# Patient Record
Sex: Female | Born: 1940 | Race: White | Hispanic: No | Marital: Married | Smoking: Former smoker
Health system: Southern US, Community
[De-identification: ages and names within clinical notes are randomized; demographics above are authoritative.]

## PROBLEM LIST (undated history)

## (undated) DIAGNOSIS — M7989 Other specified soft tissue disorders: Secondary | ICD-10-CM

## (undated) DIAGNOSIS — F17200 Nicotine dependence, unspecified, uncomplicated: Secondary | ICD-10-CM

## (undated) DIAGNOSIS — J449 Chronic obstructive pulmonary disease, unspecified: Secondary | ICD-10-CM

## (undated) DIAGNOSIS — C801 Malignant (primary) neoplasm, unspecified: Secondary | ICD-10-CM

## (undated) DIAGNOSIS — M1990 Unspecified osteoarthritis, unspecified site: Secondary | ICD-10-CM

## (undated) HISTORY — PX: NEPHRECTOMY: SHX65

## (undated) HISTORY — PX: TRIGGER FINGER RELEASE: SHX641

## (undated) HISTORY — PX: KNEE ARTHROSCOPY: SUR90

## (undated) HISTORY — PX: BACK SURGERY: SHX140

## (undated) HISTORY — DX: Malignant (primary) neoplasm, unspecified: C80.1

## (undated) HISTORY — PX: JOINT REPLACEMENT: SHX530

## (undated) HISTORY — PX: ORIF HIP FRACTURE: SHX2125

## (undated) HISTORY — PX: SPINE SURGERY: SHX786

---

## 2000-02-11 ENCOUNTER — Encounter: Payer: Self-pay | Admitting: Urology

## 2000-02-13 ENCOUNTER — Inpatient Hospital Stay (HOSPITAL_COMMUNITY): Admission: RE | Admit: 2000-02-13 | Discharge: 2000-02-14 | Payer: Self-pay | Admitting: Urology

## 2000-09-06 ENCOUNTER — Encounter: Payer: Self-pay | Admitting: Urology

## 2000-09-06 ENCOUNTER — Encounter: Admission: RE | Admit: 2000-09-06 | Discharge: 2000-09-06 | Payer: Self-pay | Admitting: Urology

## 2001-03-16 ENCOUNTER — Encounter: Admission: RE | Admit: 2001-03-16 | Discharge: 2001-03-16 | Payer: Self-pay | Admitting: Urology

## 2001-03-16 ENCOUNTER — Encounter: Payer: Self-pay | Admitting: Urology

## 2001-09-20 ENCOUNTER — Encounter: Payer: Self-pay | Admitting: Urology

## 2001-09-20 ENCOUNTER — Encounter: Admission: RE | Admit: 2001-09-20 | Discharge: 2001-09-20 | Payer: Self-pay | Admitting: Urology

## 2002-04-04 ENCOUNTER — Encounter: Payer: Self-pay | Admitting: Urology

## 2002-04-04 ENCOUNTER — Encounter: Admission: RE | Admit: 2002-04-04 | Discharge: 2002-04-04 | Payer: Self-pay | Admitting: Urology

## 2003-01-16 ENCOUNTER — Encounter: Payer: Self-pay | Admitting: Urology

## 2003-01-16 ENCOUNTER — Encounter: Admission: RE | Admit: 2003-01-16 | Discharge: 2003-01-16 | Payer: Self-pay | Admitting: Urology

## 2003-07-18 ENCOUNTER — Ambulatory Visit (HOSPITAL_COMMUNITY): Admission: RE | Admit: 2003-07-18 | Discharge: 2003-07-18 | Payer: Self-pay | Admitting: Urology

## 2004-01-05 ENCOUNTER — Inpatient Hospital Stay (HOSPITAL_COMMUNITY): Admission: EM | Admit: 2004-01-05 | Discharge: 2004-01-08 | Payer: Self-pay | Admitting: Emergency Medicine

## 2004-02-06 ENCOUNTER — Encounter: Admission: RE | Admit: 2004-02-06 | Discharge: 2004-02-06 | Payer: Self-pay | Admitting: Urology

## 2004-03-12 ENCOUNTER — Encounter: Admission: RE | Admit: 2004-03-12 | Discharge: 2004-03-12 | Payer: Self-pay | Admitting: Urology

## 2005-04-03 ENCOUNTER — Encounter: Admission: RE | Admit: 2005-04-03 | Discharge: 2005-04-03 | Payer: Self-pay | Admitting: Urology

## 2010-06-14 ENCOUNTER — Encounter: Payer: Self-pay | Admitting: Urology

## 2010-08-16 ENCOUNTER — Ambulatory Visit (HOSPITAL_COMMUNITY)
Admission: RE | Admit: 2010-08-16 | Discharge: 2010-08-16 | Disposition: A | Discharge status: Home / Self Care | Payer: Medicare Other | Source: Ambulatory Visit | Attending: Family Medicine | Admitting: Family Medicine

## 2010-08-16 ENCOUNTER — Other Ambulatory Visit: Payer: Self-pay | Admitting: Family Medicine

## 2010-08-16 DIAGNOSIS — R1032 Left lower quadrant pain: Secondary | ICD-10-CM | POA: Insufficient documentation

## 2010-08-16 DIAGNOSIS — J984 Other disorders of lung: Secondary | ICD-10-CM | POA: Insufficient documentation

## 2010-08-16 DIAGNOSIS — K7689 Other specified diseases of liver: Secondary | ICD-10-CM | POA: Insufficient documentation

## 2010-08-16 DIAGNOSIS — R110 Nausea: Secondary | ICD-10-CM | POA: Insufficient documentation

## 2010-08-16 DIAGNOSIS — R300 Dysuria: Secondary | ICD-10-CM | POA: Insufficient documentation

## 2010-08-16 DIAGNOSIS — M47817 Spondylosis without myelopathy or radiculopathy, lumbosacral region: Secondary | ICD-10-CM | POA: Insufficient documentation

## 2010-08-16 DIAGNOSIS — Z905 Acquired absence of kidney: Secondary | ICD-10-CM | POA: Insufficient documentation

## 2010-08-16 DIAGNOSIS — K5732 Diverticulitis of large intestine without perforation or abscess without bleeding: Secondary | ICD-10-CM | POA: Insufficient documentation

## 2010-08-16 LAB — CREATININE, SERUM
GFR calc Af Amer: >60 mL/min (ref >60)
GFR calc non Af Amer: >60 mL/min (ref >60)

## 2010-08-16 LAB — BUN: BUN: 12 mg/dL (ref 6–23)

## 2010-08-16 MED ORDER — IOHEXOL 300 MG/ML  SOLN
80.0000 mL | Freq: Once | INTRAMUSCULAR | Status: AC | PRN
  Administered 2010-08-16: 80 mL via INTRAVENOUS

## 2013-01-22 ENCOUNTER — Ambulatory Visit (INDEPENDENT_AMBULATORY_CARE_PROVIDER_SITE_OTHER): Payer: Medicare Other | Admitting: Emergency Medicine

## 2013-01-22 ENCOUNTER — Ambulatory Visit: Payer: Medicare Other

## 2013-01-22 VITALS — BP 150/90 | HR 93 | Temp 98.5°F | Resp 15 | Ht 64.8 in | Wt 144.0 lb

## 2013-01-22 DIAGNOSIS — R05 Cough: Secondary | ICD-10-CM

## 2013-01-22 DIAGNOSIS — J209 Acute bronchitis, unspecified: Secondary | ICD-10-CM

## 2013-01-22 DIAGNOSIS — J3489 Other specified disorders of nose and nasal sinuses: Secondary | ICD-10-CM | POA: Diagnosis not present

## 2013-01-22 DIAGNOSIS — F17200 Nicotine dependence, unspecified, uncomplicated: Secondary | ICD-10-CM

## 2013-01-22 DIAGNOSIS — Z720 Tobacco use: Secondary | ICD-10-CM | POA: Diagnosis present

## 2013-01-22 DIAGNOSIS — R0981 Nasal congestion: Secondary | ICD-10-CM

## 2013-01-22 LAB — POCT CBC
Hemoglobin: 16.9 g/dL — AB (ref 12.2–16.2)
MCH, POC: 31.4 pg — AB (ref 27–31.2)
MCV: 95.1 fL (ref 80–97)
MPV: 9.2 fL (ref 0–99.8)
POC MID %: 5.3 %M (ref 0–12)
RBC: 5.38 M/uL (ref 4.04–5.48)
WBC: 14.1 10*3/uL — AB (ref 4.6–10.2)

## 2013-01-22 MED ORDER — BENZONATATE 100 MG PO CAPS: 100.0000 mg | ORAL_CAPSULE | Freq: Three times a day (TID) | ORAL | Status: DC | PRN

## 2013-01-22 MED ORDER — HYDROCOD POLST-CHLORPHEN POLST 10-8 MG/5ML PO LQCR: 5.0000 mL | Freq: Two times a day (BID) | ORAL | Status: DC | PRN

## 2013-01-22 MED ORDER — LEVOFLOXACIN 500 MG PO TABS: 500.0000 mg | ORAL_TABLET | Freq: Every day | ORAL | Status: AC

## 2013-01-22 NOTE — Progress Notes (Signed)
  Subjective:    Patient ID: Sharon Wilkinson, female    DOB: Aug 01, 1940, 72 y.o.   MRN: [id number]  HPI 71 yo female presents today with a cough for about 10 days. She has had some mucus sometimes. No wheezing, not SOB, She has not had any xray since the last time she was in office. She has had about 1 pack of cigarettes over the last month.     Review of Systems     Objective:   Physical Exam is alert and cooperative she is not in any distress. There is wax in the left external auditory canal. The right TM is normal. There is significant clear rhinorrhea appear throat is normal she has dentures. Chest exam reveals coarse rhonchi present in both lung fields. There are no areas of dullness. Heart regular rate no murmurs or gallops. Abdomen soft nontender [facility] reading (PRIMARY) by  Dr.Jackelyne Sayer there are increased interstitial markings in both bases and right middle lobe. There no consolidated areas.  Results for orders placed in visit on 01/22/13  POCT CBC      Result Value Range   WBC 14.1 (*) 4.6 - 10.2 K/uL   Lymph, poc 3.1  0.6 - 3.4   POC LYMPH PERCENT 21.7  10 - 50 %L   MID (cbc) 0.7  0 - 0.9   POC MID % 5.3  0 - 12 %M   POC Granulocyte 10.3 (*) 2 - 6.9   Granulocyte percent 73.0  37 - 80 %G   RBC 5.38  4.04 - 5.48 M/uL   Hemoglobin 16.9 (*) 12.2 - 16.2 g/dL   HCT, POC 14.7 (*) 82.9 - 47.9 %   MCV 95.1  80 - 97 fL   MCH, POC 31.4 (*) 27 - 31.2 pg   MCHC 33.0  31.8 - 35.4 g/dL   RDW, POC 56.2     Platelet Count, POC 321  142 - 424 K/uL   MPV 9.2  0 - 99.8 fL         Assessment & Plan:  14,000. Gland treat with Levaquin medication for cough with Tessalon Perles and to take Mucinex twice a day. Recheck in 48-72 hours if not improving.

## 2013-01-22 NOTE — Patient Instructions (Signed)

## 2015-06-19 DIAGNOSIS — M1711 Unilateral primary osteoarthritis, right knee: Secondary | ICD-10-CM | POA: Diagnosis not present

## 2015-06-19 DIAGNOSIS — M25551 Pain in right hip: Secondary | ICD-10-CM | POA: Diagnosis not present

## 2016-02-11 ENCOUNTER — Ambulatory Visit (INDEPENDENT_AMBULATORY_CARE_PROVIDER_SITE_OTHER): Payer: Medicare Other | Admitting: Urgent Care

## 2016-02-11 ENCOUNTER — Ambulatory Visit (INDEPENDENT_AMBULATORY_CARE_PROVIDER_SITE_OTHER): Payer: Medicare Other

## 2016-02-11 VITALS — BP 122/72 | HR 78 | Temp 97.6°F | Resp 17 | Ht 66.0 in | Wt 148.0 lb

## 2016-02-11 DIAGNOSIS — M545 Low back pain: Secondary | ICD-10-CM

## 2016-02-11 DIAGNOSIS — M25551 Pain in right hip: Secondary | ICD-10-CM | POA: Diagnosis not present

## 2016-02-11 DIAGNOSIS — S79911A Unspecified injury of right hip, initial encounter: Secondary | ICD-10-CM | POA: Diagnosis not present

## 2016-02-11 DIAGNOSIS — W19XXXA Unspecified fall, initial encounter: Secondary | ICD-10-CM

## 2016-02-11 DIAGNOSIS — M1611 Unilateral primary osteoarthritis, right hip: Secondary | ICD-10-CM

## 2016-02-11 DIAGNOSIS — M5136 Other intervertebral disc degeneration, lumbar region: Secondary | ICD-10-CM | POA: Diagnosis not present

## 2016-02-11 DIAGNOSIS — S3992XA Unspecified injury of lower back, initial encounter: Secondary | ICD-10-CM | POA: Diagnosis not present

## 2016-02-11 MED ORDER — PREDNISONE 20 MG PO TABS: ORAL_TABLET | ORAL | 0 refills | Status: DC

## 2016-02-11 NOTE — Patient Instructions (Addendum)
Degenerative Disk Disease  Degenerative disk disease is a condition caused by the changes that occur in spinal disks as you grow older. Spinal disks are soft and compressible disks located between the bones of your spine (vertebrae). These disks act like shock absorbers. Degenerative disk disease can affect the whole spine. However, the neck and lower back are most commonly affected. Many changes can occur in the spinal disks with aging, such as:  · The spinal disks may dry and shrink.  · Small tears may occur in the tough, outer covering of the disk (annulus).  · The disk space may become smaller due to loss of water.  · Abnormal growths in the bone (spurs) may occur. This can put pressure on the nerve roots exiting the spinal canal, causing pain.  · The spinal canal may become narrowed.  RISK FACTORS   · Being overweight.  · Having a family history of degenerative disk disease.  · Smoking.  · There is increased risk if you are doing heavy lifting or have a sudden injury.  SIGNS AND SYMPTOMS   Symptoms vary from person to person and may include:  · Pain that varies in intensity. Some people have no pain, while others have severe pain. The location of the pain depends on the part of your backbone that is affected.  ¨ You will have neck or arm pain if a disk in the neck area is affected.  ¨ You will have pain in your back, buttocks, or legs if a disk in the lower back is affected.  · Pain that becomes worse while bending, reaching up, or with twisting movements.  · Pain that may start gradually and then get worse as time passes. It may also start after a major or minor injury.  · Numbness or tingling in the arms or legs.  DIAGNOSIS   Your health care provider will ask you about your symptoms and about activities or habits that may cause the pain. He or she may also ask about any injuries, diseases, or treatments you have had. Your health care provider will examine you to check for the range of movement that is  possible in the affected area, to check for strength in your extremities, and to check for sensation in the areas of the arms and legs supplied by different nerve roots. You may also have:   · An X-ray of the spine.  · Other imaging tests, such as MRI.  TREATMENT   Your health care provider will advise you on the best plan for treatment. Treatment may include:  · Medicines.  · Rehabilitation exercises.  HOME CARE INSTRUCTIONS   · Follow proper lifting and walking techniques as advised by your health care provider.  · Maintain good posture.  · Exercise regularly as advised by your health care provider.  · Perform relaxation exercises.  · Change your sitting, standing, and sleeping habits as advised by your health care provider.  · Change positions frequently.  · Lose weight or maintain a healthy weight as advised by your health care provider.  · Do not use any tobacco products, including cigarettes, chewing tobacco, or electronic cigarettes. If you need help quitting, ask your health care provider.  · Wear supportive footwear.  · Take medicines only as directed by your health care provider.  SEEK MEDICAL CARE IF:   · Your pain does not go away within 1-4 weeks.  · You have significant appetite or weight loss.  SEEK IMMEDIATE MEDICAL CARE IF:   ·   instructions.  Will watch your condition.  Will get help right away if you are not doing well or get worse.   This information is not intended to replace advice given to you by your health care provider. Make sure you discuss any questions you have with your health care provider.   Document Released: 03/08/2007 Document Revised: 06/01/2014 Document Reviewed: 09/12/2013 Elsevier Interactive Patient Education 2016 [employer].     IF you received an  x-ray today, you will receive an invoice from [facility]. Please contact [facility] at [phone] with questions or concerns regarding your invoice.   IF you received labwork today, you will receive an invoice from [employer]. Please contact [facility] at [phone] with questions or concerns regarding your invoice.   Our billing staff will not be able to assist you with questions regarding bills from these companies.  You will be contacted with the lab results as soon as they are available. The fastest way to get your results is to activate your My Chart account. Instructions are located on the last page of this paperwork. If you have not heard from [place] regarding the results in 2 weeks, please contact this office.

## 2016-02-11 NOTE — Progress Notes (Signed)
MRN: [id number] DOB: 1940-08-16  Subjective:   CHENITA WOLIVER is a 75 y.o. female presenting for chief complaint of Hip Pain (right side )  Reports 1 week history of worsening right hip pain s/p fall. Pain is constant, worse with lying down, walking for long distances. She also has low back pain, feels like it shoots down her thigh occasionally. Has been using 400mg  ibuprofen daily with minimal relief. Denies history of arthritis, hip or back surgeries.   Ashiana currently has no medications in their medication list. Also has No Known Allergies.  Jolana  has a past medical history of Cancer ([place]). Also  has a past surgical history that includes Spine surgery and Joint replacement.  Objective:   Vitals: BP 122/72 (BP Location: Right Arm, Patient Position: Sitting, Cuff Size: Normal)   Pulse 78   Temp 97.6 F (36.4 C) (Oral)   Resp 17   Ht 5\' 6"  (1.676 m)   Wt 148 lb (67.1 kg)   SpO2 97%   BMI 23.89 kg/m   Physical Exam  Constitutional: She is oriented to person, place, and time. She appears well-developed and well-nourished.  Cardiovascular: Normal rate.   Pulmonary/Chest: Effort normal.  Abdominal: Soft. Bowel sounds are normal. She exhibits no distension and no mass. There is no tenderness. There is no guarding.  Musculoskeletal:       Right hip: She exhibits decreased range of motion (external and internal rotation, flexion) and tenderness (over area depicted). She exhibits normal strength, no bony tenderness, no swelling, no crepitus, no deformity and no laceration.       Lumbar back: She exhibits decreased range of motion (flexion, extension, negative SLR). She exhibits no tenderness, no bony tenderness, no swelling, no edema, no deformity, no laceration and no spasm.       Legs: Neurological: She is alert and oriented to person, place, and time.  Skin: Skin is warm and dry.   Dg Lumbar Spine Complete  Result Date: 02/11/2016 CLINICAL DATA:  One week history of worsening  RIGHT hip pain after a fall. EXAM: LUMBAR SPINE - COMPLETE 4+ VIEW COMPARISON:  None. FINDINGS: There is no evidence of lumbar spine fracture. Alignment is normal. Multilevel intervertebral disc space narrowing, worst at L4-5. Lower lumbar facet arthropathy. Osteopenia. IMPRESSION: Lumbar degenerative disc disease.  No compression fracture. Electronically Signed   By: Staci Righter M.D.   On: 02/11/2016 12:35   Dg Hip Unilat W Or W/o Pelvis 2-3 Views Right  Result Date: 02/11/2016 CLINICAL DATA:  75 year old female with 1 week history of worsening right hip pain post fall. Surgery 10 years ago. Initial encounter. EXAM: DG HIP (WITH OR WITHOUT PELVIS) 2-3V RIGHT COMPARISON:  None. FINDINGS: Post right hip surgery with dynamic sliding type screw without complication noted. Bony overgrowth at the lesser trochanter and greater trochanter level appear chronic. No acute fracture or dislocation. Degenerative changes lumbar spine most notable L4-5 level incompletely assessed. IMPRESSION: Post right hip surgery without acute abnormality noted. L4-5 degenerative changes. Electronically Signed   By: Genia Del M.D.   On: 02/11/2016 12:36     Assessment and Plan :   1. Low back pain without sciatica, unspecified back pain laterality 2. Right hip pain 3. Fall, initial encounter - Patient likely suffered hip contusion but physical exam findings are very reassuring. Anticipatory guidance provided. See medications as below.  4. DDD (degenerative disc disease), lumbar 5. Osteoarthritis of right hip, unspecified osteoarthritis type - Will have patient undergo short  steroid course for her pain and inflammation likely worsened by her recent fall. RTC in 1 week if no improvement. Patient may also use APAP for pain management.  6. Osteopenia - Discussed findings with patient, she will need a bone scan. I recommended patient set up f/u with her PCP for this. Patient agreed.   Jaynee Eagles, PA-C [facility] [phone] 02/11/2016 12:06 PM

## 2016-08-05 DIAGNOSIS — M25551 Pain in right hip: Secondary | ICD-10-CM | POA: Diagnosis not present

## 2016-08-05 DIAGNOSIS — M25561 Pain in right knee: Secondary | ICD-10-CM | POA: Diagnosis not present

## 2016-11-02 DIAGNOSIS — M79641 Pain in right hand: Secondary | ICD-10-CM | POA: Diagnosis not present

## 2017-03-10 DIAGNOSIS — M65341 Trigger finger, right ring finger: Secondary | ICD-10-CM | POA: Diagnosis not present

## 2017-04-27 IMAGING — DX DG HIP (WITH OR WITHOUT PELVIS) 2-3V*R*
4 series · 4 of 4 positions shown · non-contrast
Comparison: None.

CLINICAL DATA: 75-year-old female with 1 week history of worsening
right hip pain post fall. Surgery 10 years ago. Initial encounter.

EXAM:
DG HIP (WITH OR WITHOUT PELVIS) 2-3V RIGHT

[pelvis ap]
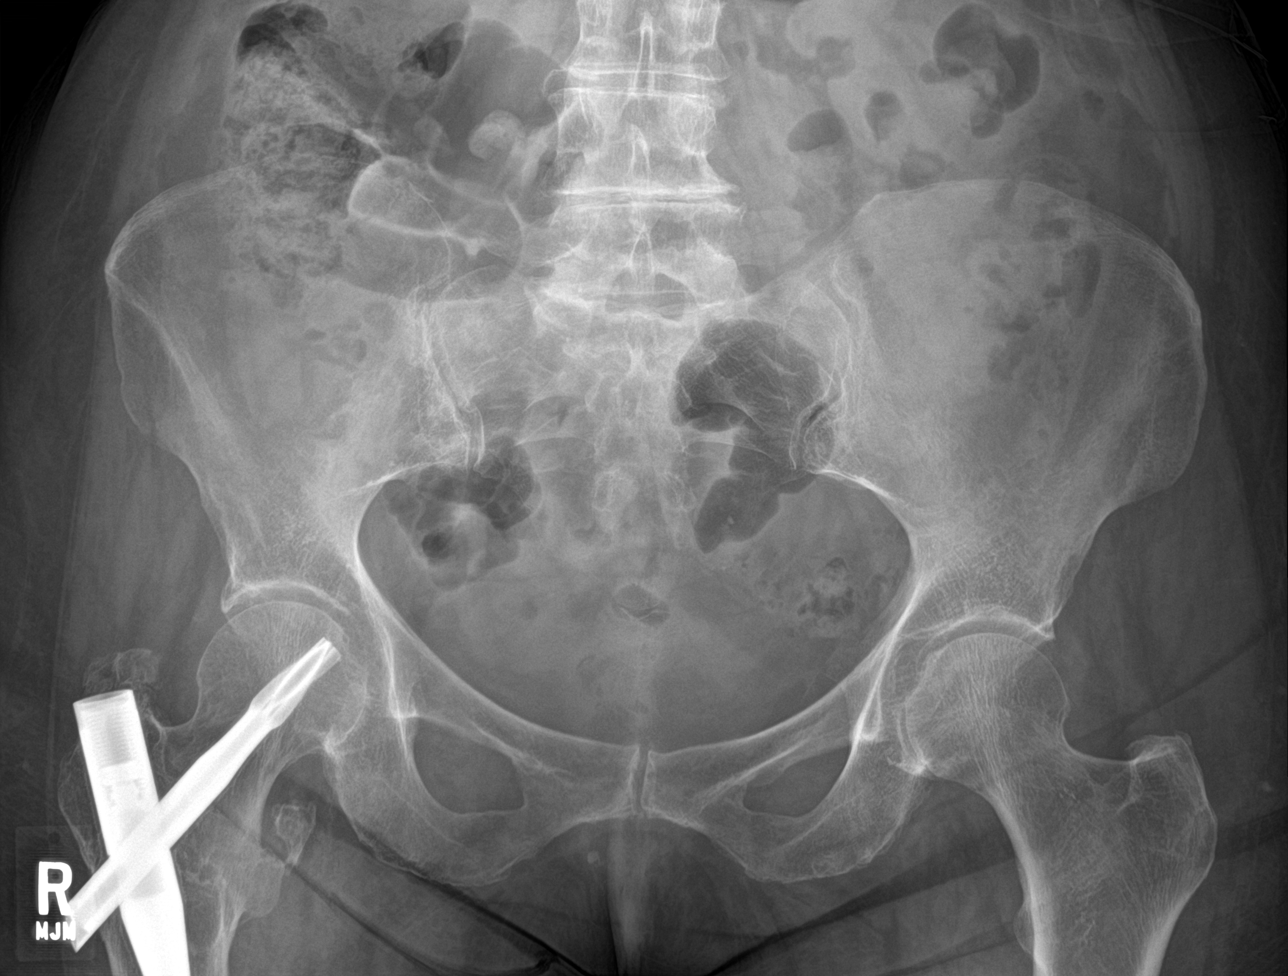

[hip ap]
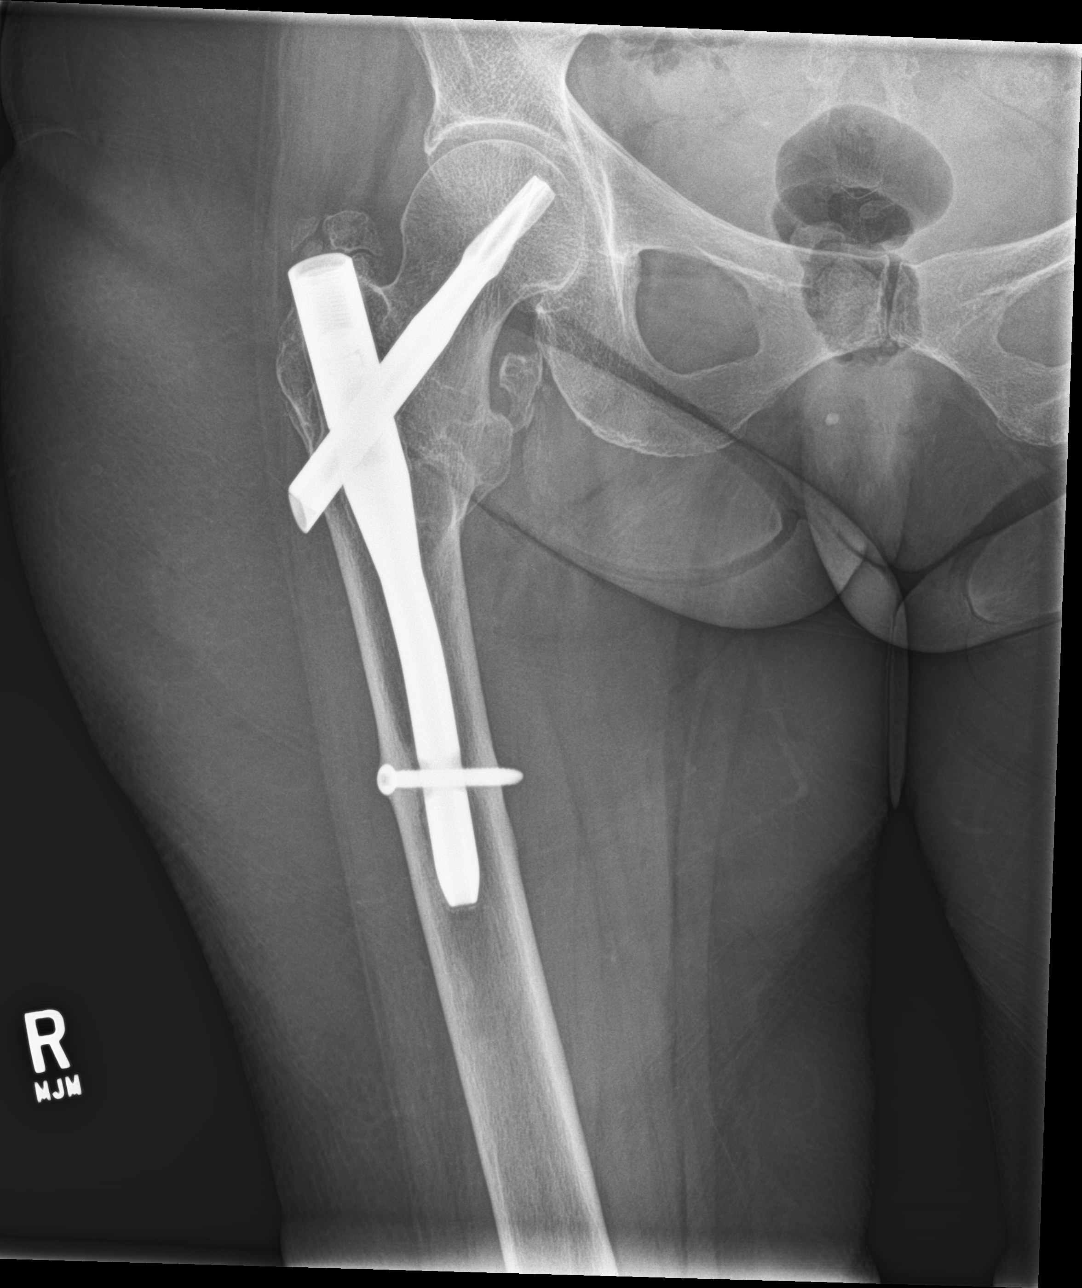

[hip lat (1 of 2)]
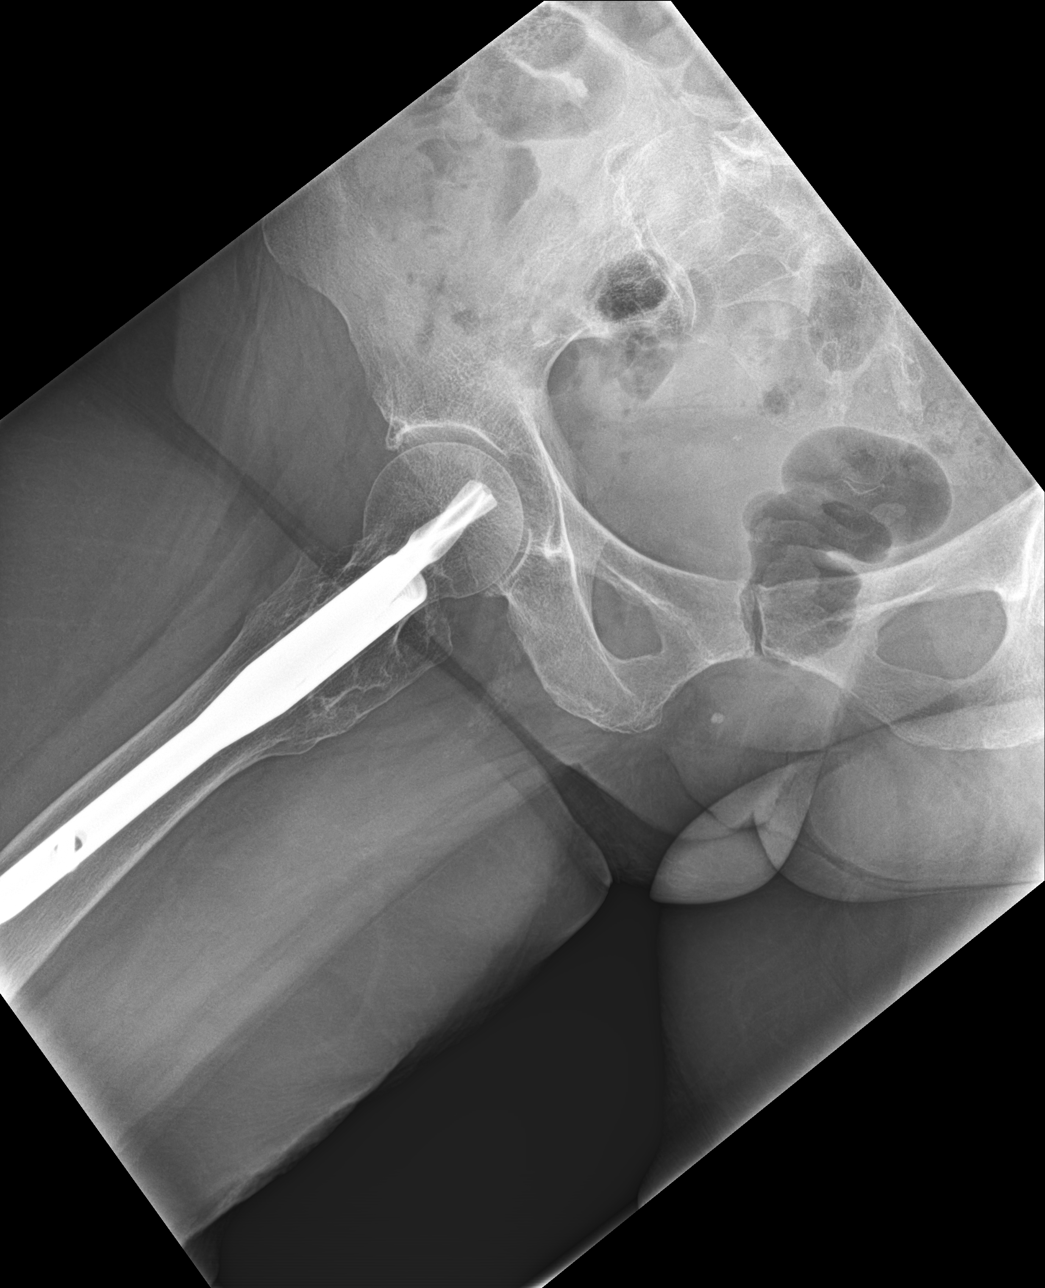

[hip lat (2 of 2)]
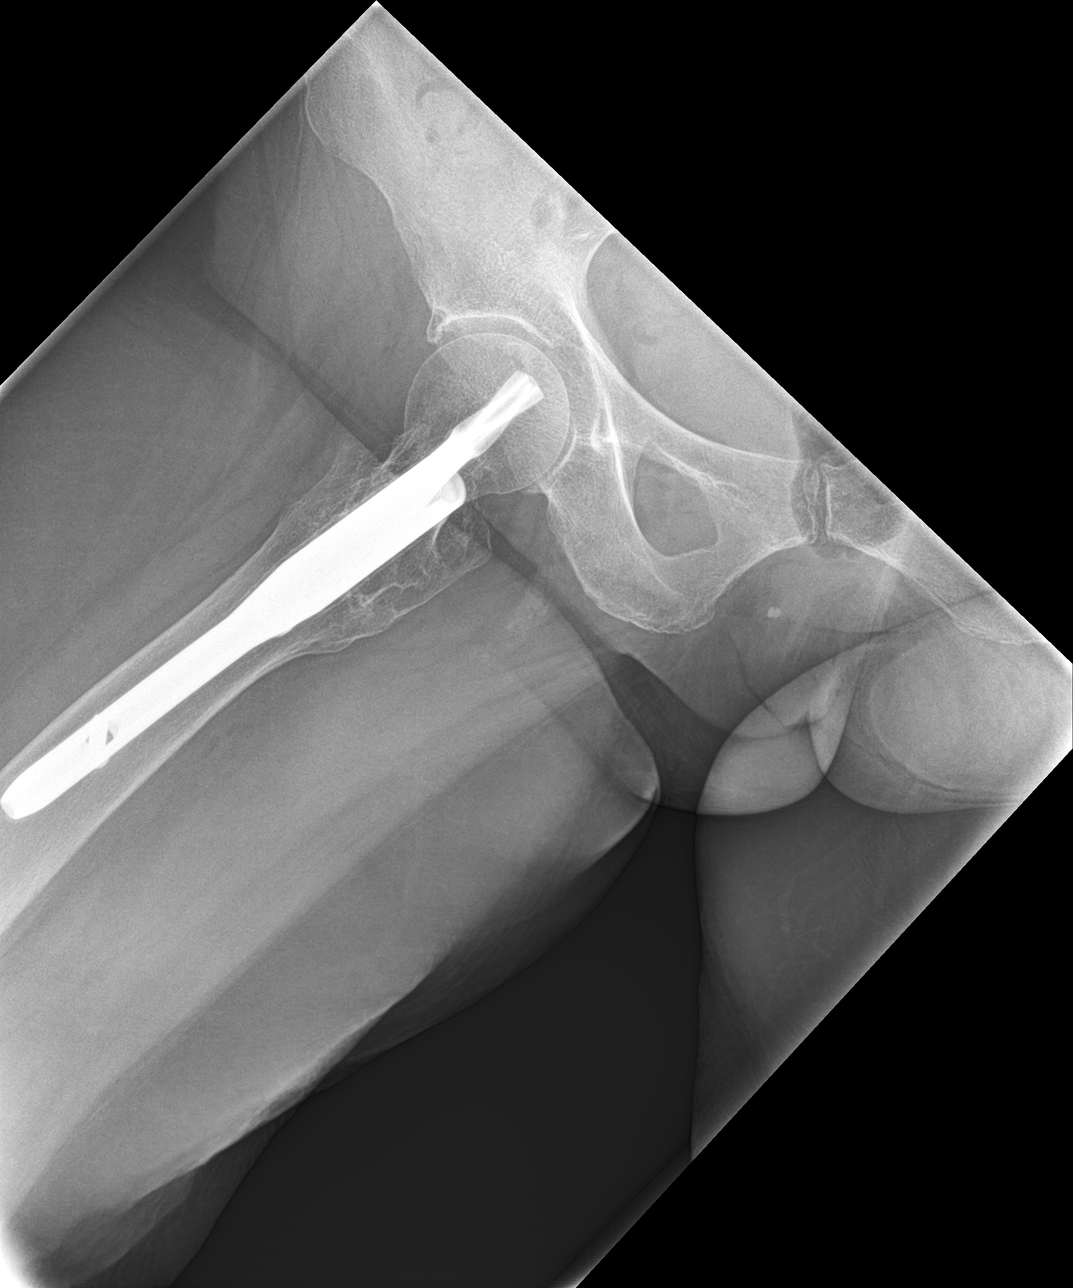

[4 of 4 positions shown; findings below may reference images not displayed]

FINDINGS: Post right hip surgery with dynamic sliding type screw without
complication noted. Bony overgrowth at the lesser trochanter and
greater trochanter level appear chronic.

No acute fracture or dislocation.

Degenerative changes lumbar spine most notable L4-5 level
incompletely assessed.
IMPRESSION: Post right hip surgery without acute abnormality noted.

L4-5 degenerative changes.

## 2017-06-29 DIAGNOSIS — M65341 Trigger finger, right ring finger: Secondary | ICD-10-CM | POA: Diagnosis not present

## 2017-09-27 DIAGNOSIS — M65341 Trigger finger, right ring finger: Secondary | ICD-10-CM | POA: Diagnosis not present

## 2017-10-07 DIAGNOSIS — M65341 Trigger finger, right ring finger: Secondary | ICD-10-CM | POA: Diagnosis not present

## 2017-10-19 DIAGNOSIS — Z9889 Other specified postprocedural states: Secondary | ICD-10-CM | POA: Diagnosis not present

## 2018-11-30 DIAGNOSIS — D171 Benign lipomatous neoplasm of skin and subcutaneous tissue of trunk: Secondary | ICD-10-CM | POA: Diagnosis not present

## 2018-12-26 DIAGNOSIS — B0229 Other postherpetic nervous system involvement: Secondary | ICD-10-CM | POA: Diagnosis not present

## 2019-01-05 ENCOUNTER — Ambulatory Visit: Payer: Self-pay | Admitting: Surgery

## 2019-01-05 DIAGNOSIS — M7989 Other specified soft tissue disorders: Secondary | ICD-10-CM | POA: Diagnosis not present

## 2019-01-16 ENCOUNTER — Other Ambulatory Visit: Payer: Self-pay

## 2019-01-16 ENCOUNTER — Encounter (HOSPITAL_BASED_OUTPATIENT_CLINIC_OR_DEPARTMENT_OTHER): Payer: Self-pay | Admitting: *Deleted

## 2019-01-19 ENCOUNTER — Other Ambulatory Visit (HOSPITAL_COMMUNITY)
Admission: RE | Admit: 2019-01-19 | Discharge: 2019-01-19 | Disposition: A | Discharge status: Home / Self Care | Payer: Medicare Other | Source: Ambulatory Visit | Attending: Surgery | Admitting: Surgery

## 2019-01-19 DIAGNOSIS — Z20828 Contact with and (suspected) exposure to other viral communicable diseases: Secondary | ICD-10-CM | POA: Diagnosis not present

## 2019-01-19 DIAGNOSIS — Z01812 Encounter for preprocedural laboratory examination: Secondary | ICD-10-CM | POA: Insufficient documentation

## 2019-01-19 LAB — SARS CORONAVIRUS 2 (TAT 6-24 HRS): SARS Coronavirus 2: NEGATIVE

## 2019-01-22 ENCOUNTER — Encounter (HOSPITAL_BASED_OUTPATIENT_CLINIC_OR_DEPARTMENT_OTHER): Payer: Self-pay | Admitting: Surgery

## 2019-01-22 DIAGNOSIS — M7989 Other specified soft tissue disorders: Secondary | ICD-10-CM | POA: Diagnosis present

## 2019-01-22 NOTE — H&P (Signed)
General Surgery [facility]  Sharon Wilkinson DOB: 05/22/1941 Married / Language: Cleophus Molt / Race: White Female   History of Present Illness  The patient is a 78 year old female who presents with a soft tissue mass.  CHIEF COMPLAINT: soft tissue mass left shoulder  Patient is referred by Dr. Lovey Newcomer for surgical evaluation and management of soft tissue mass involving the left shoulder. Patient states that this is been present for several years and gradually increasing in size. She is having some neck and shoulder pain. She was evaluated by her dermatologist and referred to our office for excision. She does not have any history of trauma. She has had no previous such lesions removed. She is accompanied today by her sister.   Past Surgical History Nephrectomy  Right.  Allergies  No Known Drug Allergies  [01/05/2019]: Allergies Reconciled   Medication History No Current Medications Medications Reconciled  Family History First Degree Relatives  No pertinent family history   Pregnancy / Birth History Age at menarche  57 years.  Other Problems  Back Pain   Review of Systems  Skin Present- Rash. Not Present- Change in Wart/Mole, Dryness, Hives, Jaundice, New Lesions, Non-Healing Wounds and Ulcer. Cardiovascular Not Present- Chest Pain, Difficulty Breathing Lying Down, Leg Cramps, Palpitations, Rapid Heart Rate, Shortness of Breath and Swelling of Extremities. Gastrointestinal Not Present- Abdominal Pain, Bloating, Bloody Stool, Change in Bowel Habits, Chronic diarrhea, Constipation, Difficulty Swallowing, Excessive gas, Gets full quickly at meals, Hemorrhoids, Indigestion, Nausea, Rectal Pain and Vomiting. Female Genitourinary Not Present- Frequency, Nocturia, Painful Urination, Pelvic Pain and Urgency.  Vitals  Weight: 127.6 lb Height: 66in Body Surface Area: 1.65 m Body Mass Index: 20.59 kg/m  Temp.: 98.59F  Pulse: 92 (Regular)   BP: 128/74(Sitting, Left Arm, Standard)   Physical Exam  See vital signs recorded above  GENERAL APPEARANCE Development: normal Nutritional status: normal Gross deformities: none  SKIN Rash, lesions, ulcers: none Induration, erythema: none Nodules: none palpable  EYES Conjunctiva and lids: normal Pupils: equal and reactive Iris: normal bilaterally  EARS, NOSE, MOUTH, THROAT External ears: no lesion or deformity External nose: no lesion or deformity Hearing: grossly normal  NECK Symmetric: yes Trachea: midline Thyroid: no palpable nodules in the thyroid bed  CHEST Respiratory effort: normal Retraction or accessory muscle use: no Breath sounds: normal bilaterally Rales, rhonchi, wheeze: none  CARDIOVASCULAR Auscultation: regular rhythm, normal rate Murmurs: none Pulses: carotid and radial pulse 2+ palpable Lower extremity edema: none Lower extremity varicosities: none  MUSCULOSKELETAL Station and gait: normal Digits and nails: no clubbing or cyanosis Muscle strength: grossly normal all extremities Range of motion: grossly normal all extremities Deformity: Soft tissue mass on posterior left shoulder, medial aspect of the scapula, 8 x 6 x 3 cm in size, mobile, nontender  LYMPHATIC Cervical: none palpable Supraclavicular: none palpable  PSYCHIATRIC Oriented to person, place, and time: yes Mood and affect: normal for situation Judgment and insight: appropriate for situation    Assessment & Plan   SOFT TISSUE MASS (M79.89)  Patient is referred by her dermatologist for surgical evaluation and management of a soft tissue mass on the left posterior shoulder. Patient is accompanied by her sister.  Mass measures approximately 8 cm in greatest dimension. It is mobile and nontender. Patient has had some posterior shoulder and neck pain which may or may not be associated with this mass. She desires surgical excision. We discussed the procedure. We  discussed the location of the surgical incision.  We discussed doing this as an outpatient surgery. We discussed the possibility of seroma formation and need for aspiration. Patient understands and wishes to proceed with surgery in the near future.  The risks and benefits of the procedure have been discussed at length with the patient. The patient understands the proposed procedure, potential alternative treatments, and the course of recovery to be expected. All of the patient's questions have been answered at this time. The patient wishes to proceed with surgery.  Armandina Gemma, [facility] Office: [phone]

## 2019-01-23 ENCOUNTER — Encounter (HOSPITAL_BASED_OUTPATIENT_CLINIC_OR_DEPARTMENT_OTHER): Payer: Self-pay | Admitting: Certified Registered Nurse Anesthetist

## 2019-01-23 ENCOUNTER — Ambulatory Visit (HOSPITAL_BASED_OUTPATIENT_CLINIC_OR_DEPARTMENT_OTHER)
Admission: RE | Admit: 2019-01-23 | Discharge: 2019-01-23 | Disposition: A | Discharge status: Home / Self Care | Payer: Medicare Other | Attending: Surgery | Admitting: Surgery

## 2019-01-23 ENCOUNTER — Encounter (HOSPITAL_BASED_OUTPATIENT_CLINIC_OR_DEPARTMENT_OTHER)
Admission: RE | Disposition: A | Discharge status: Home / Self Care | Payer: Self-pay | Source: Home / Self Care | Attending: Surgery

## 2019-01-23 ENCOUNTER — Other Ambulatory Visit: Payer: Self-pay

## 2019-01-23 ENCOUNTER — Ambulatory Visit (HOSPITAL_BASED_OUTPATIENT_CLINIC_OR_DEPARTMENT_OTHER): Payer: Medicare Other | Admitting: Certified Registered"

## 2019-01-23 DIAGNOSIS — M7989 Other specified soft tissue disorders: Secondary | ICD-10-CM

## 2019-01-23 DIAGNOSIS — J449 Chronic obstructive pulmonary disease, unspecified: Secondary | ICD-10-CM | POA: Diagnosis not present

## 2019-01-23 DIAGNOSIS — Z905 Acquired absence of kidney: Secondary | ICD-10-CM | POA: Insufficient documentation

## 2019-01-23 DIAGNOSIS — F17200 Nicotine dependence, unspecified, uncomplicated: Secondary | ICD-10-CM | POA: Diagnosis not present

## 2019-01-23 DIAGNOSIS — R2232 Localized swelling, mass and lump, left upper limb: Secondary | ICD-10-CM | POA: Insufficient documentation

## 2019-01-23 DIAGNOSIS — M1990 Unspecified osteoarthritis, unspecified site: Secondary | ICD-10-CM | POA: Insufficient documentation

## 2019-01-23 DIAGNOSIS — D171 Benign lipomatous neoplasm of skin and subcutaneous tissue of trunk: Secondary | ICD-10-CM | POA: Diagnosis not present

## 2019-01-23 HISTORY — DX: Unspecified osteoarthritis, unspecified site: M19.90

## 2019-01-23 HISTORY — PX: MASS EXCISION: SHX2000

## 2019-01-23 HISTORY — DX: Nicotine dependence, unspecified, uncomplicated: F17.200

## 2019-01-23 HISTORY — DX: Chronic obstructive pulmonary disease, unspecified: J44.9

## 2019-01-23 HISTORY — DX: Other specified soft tissue disorders: M79.89

## 2019-01-23 SURGERY — EXCISION MASS
Anesthesia: General | Site: Shoulder | Laterality: Left

## 2019-01-23 MED ORDER — ACETAMINOPHEN 500 MG PO TABS
1000.0000 mg | ORAL_TABLET | Freq: Once | ORAL | Status: AC
  Administered 2019-01-23: 11:00:00 1000 mg via ORAL

## 2019-01-23 MED ORDER — EPHEDRINE SULFATE 50 MG/ML IJ SOLN
INTRAMUSCULAR | Status: DC | PRN
  Administered 2019-01-23: 10 mg via INTRAVENOUS

## 2019-01-23 MED ORDER — EPHEDRINE 5 MG/ML INJ
INTRAVENOUS | Status: AC
  Filled 2019-01-23: qty 10

## 2019-01-23 MED ORDER — TRAMADOL HCL 50 MG PO TABS: 50.0000 mg | ORAL_TABLET | Freq: Four times a day (QID) | ORAL | 0 refills | Status: DC | PRN

## 2019-01-23 MED ORDER — LIDOCAINE 2% (20 MG/ML) 5 ML SYRINGE
INTRAMUSCULAR | Status: AC
  Filled 2019-01-23: qty 5

## 2019-01-23 MED ORDER — ACETAMINOPHEN 500 MG PO TABS
ORAL_TABLET | ORAL | Status: AC
  Filled 2019-01-23: qty 2

## 2019-01-23 MED ORDER — PROPOFOL 10 MG/ML IV BOLUS
INTRAVENOUS | Status: DC | PRN
  Administered 2019-01-23: 100 mg via INTRAVENOUS

## 2019-01-23 MED ORDER — CHLORHEXIDINE GLUCONATE CLOTH 2 % EX PADS: 6.0000 | MEDICATED_PAD | Freq: Once | CUTANEOUS | Status: DC

## 2019-01-23 MED ORDER — ONDANSETRON HCL 4 MG/2ML IJ SOLN
INTRAMUSCULAR | Status: DC | PRN
  Administered 2019-01-23: 4 mg via INTRAVENOUS

## 2019-01-23 MED ORDER — LACTATED RINGERS IV SOLN
INTRAVENOUS | Status: DC
  Administered 2019-01-23 (×2): via INTRAVENOUS

## 2019-01-23 MED ORDER — SCOPOLAMINE 1 MG/3DAYS TD PT72: 1.0000 | MEDICATED_PATCH | Freq: Once | TRANSDERMAL | Status: DC

## 2019-01-23 MED ORDER — DEXAMETHASONE SODIUM PHOSPHATE 4 MG/ML IJ SOLN
INTRAMUSCULAR | Status: DC | PRN
  Administered 2019-01-23: 5 mg via INTRAVENOUS

## 2019-01-23 MED ORDER — CEFAZOLIN SODIUM-DEXTROSE 2-4 GM/100ML-% IV SOLN
2.0000 g | INTRAVENOUS | Status: AC
  Administered 2019-01-23: 12:00:00 2 g via INTRAVENOUS

## 2019-01-23 MED ORDER — ONDANSETRON HCL 4 MG/2ML IJ SOLN
INTRAMUSCULAR | Status: AC
  Filled 2019-01-23: qty 2

## 2019-01-23 MED ORDER — CEFAZOLIN SODIUM-DEXTROSE 2-4 GM/100ML-% IV SOLN
INTRAVENOUS | Status: AC
  Filled 2019-01-23: qty 100

## 2019-01-23 MED ORDER — BUPIVACAINE-EPINEPHRINE 0.5% -1:200000 IJ SOLN
INTRAMUSCULAR | Status: DC | PRN
  Administered 2019-01-23: 10 mL

## 2019-01-23 MED ORDER — FENTANYL CITRATE (PF) 100 MCG/2ML IJ SOLN
INTRAMUSCULAR | Status: AC
  Filled 2019-01-23: qty 2

## 2019-01-23 MED ORDER — LIDOCAINE HCL (CARDIAC) PF 100 MG/5ML IV SOSY
PREFILLED_SYRINGE | INTRAVENOUS | Status: DC | PRN
  Administered 2019-01-23: 50 mg via INTRAVENOUS

## 2019-01-23 MED ORDER — FENTANYL CITRATE (PF) 100 MCG/2ML IJ SOLN: 25.0000 ug | INTRAMUSCULAR | Status: DC | PRN

## 2019-01-23 MED ORDER — MIDAZOLAM HCL 2 MG/2ML IJ SOLN
INTRAMUSCULAR | Status: AC
  Filled 2019-01-23: qty 2

## 2019-01-23 MED ORDER — PHENYLEPHRINE 40 MCG/ML (10ML) SYRINGE FOR IV PUSH (FOR BLOOD PRESSURE SUPPORT)
PREFILLED_SYRINGE | INTRAVENOUS | Status: AC
  Filled 2019-01-23: qty 10

## 2019-01-23 MED ORDER — GLYCOPYRROLATE 0.2 MG/ML IJ SOLN: INTRAMUSCULAR | Status: DC | PRN

## 2019-01-23 MED ORDER — PHENYLEPHRINE HCL (PRESSORS) 10 MG/ML IV SOLN
INTRAVENOUS | Status: DC | PRN
  Administered 2019-01-23 (×2): 80 ug via INTRAVENOUS

## 2019-01-23 MED ORDER — GLYCOPYRROLATE PF 0.2 MG/ML IJ SOSY
PREFILLED_SYRINGE | INTRAMUSCULAR | Status: AC
  Filled 2019-01-23: qty 1

## 2019-01-23 MED ORDER — FENTANYL CITRATE (PF) 100 MCG/2ML IJ SOLN
50.0000 ug | INTRAMUSCULAR | Status: DC | PRN
  Administered 2019-01-23: 50 ug via INTRAVENOUS
  Administered 2019-01-23: 12:00:00 25 ug via INTRAVENOUS

## 2019-01-23 MED ORDER — MIDAZOLAM HCL 2 MG/2ML IJ SOLN
1.0000 mg | INTRAMUSCULAR | Status: DC | PRN
  Administered 2019-01-23: 1 mg via INTRAVENOUS

## 2019-01-23 SURGICAL SUPPLY — 51 items
ADH SKN CLS APL DERMABOND .7 (GAUZE/BANDAGES/DRESSINGS) ×1
APL PRP STRL LF DISP 70% ISPRP (MISCELLANEOUS) ×1
BLADE SURG 15 STRL LF DISP TIS (BLADE) ×1 IMPLANT
BLADE SURG 15 STRL SS (BLADE) ×3
CANISTER SUCTION 1200CC (MISCELLANEOUS) IMPLANT
CHLORAPREP W/TINT 26 (MISCELLANEOUS) ×3 IMPLANT
CLEANER CAUTERY TIP 5X5 PAD (MISCELLANEOUS) IMPLANT
CLOSURE WOUND 1/2 X4 (GAUZE/BANDAGES/DRESSINGS)
COVER BACK TABLE REUSABLE LG (DRAPES) ×3 IMPLANT
COVER MAYO STAND REUSABLE (DRAPES) ×3 IMPLANT
COVER WAND RF STERILE (DRAPES) IMPLANT
DECANTER SPIKE VIAL GLASS SM (MISCELLANEOUS) IMPLANT
DERMABOND ADVANCED (GAUZE/BANDAGES/DRESSINGS) ×2
DERMABOND ADVANCED .7 DNX12 (GAUZE/BANDAGES/DRESSINGS) ×1 IMPLANT
DRAPE HALF SHEET 70X43 (DRAPES) ×2 IMPLANT
DRAPE LAPAROTOMY 100X72 PEDS (DRAPES) IMPLANT
DRAPE SPLIT 6X30 W/TAPE (DRAPES) IMPLANT
DRAPE UTILITY XL STRL (DRAPES) ×3 IMPLANT
DRSG TEGADERM 4X4.75 (GAUZE/BANDAGES/DRESSINGS) IMPLANT
ELECT REM PT RETURN 9FT ADLT (ELECTROSURGICAL) ×3
ELECTRODE REM PT RTRN 9FT ADLT (ELECTROSURGICAL) ×1 IMPLANT
GAUZE SPONGE 4X4 12PLY STRL LF (GAUZE/BANDAGES/DRESSINGS) ×3 IMPLANT
GLOVE BIO SURGEON STRL SZ 6.5 (GLOVE) ×1 IMPLANT
GLOVE BIO SURGEONS STRL SZ 6.5 (GLOVE) ×1
GLOVE BIOGEL PI IND STRL 7.0 (GLOVE) IMPLANT
GLOVE BIOGEL PI INDICATOR 7.0 (GLOVE) ×2
GLOVE SURG ORTHO 8.0 STRL STRW (GLOVE) ×3 IMPLANT
GOWN STRL REUS W/ TWL LRG LVL3 (GOWN DISPOSABLE) ×1 IMPLANT
GOWN STRL REUS W/ TWL XL LVL3 (GOWN DISPOSABLE) ×1 IMPLANT
GOWN STRL REUS W/TWL LRG LVL3 (GOWN DISPOSABLE) ×3
GOWN STRL REUS W/TWL XL LVL3 (GOWN DISPOSABLE) ×3
NDL HYPO 25X1 1.5 SAFETY (NEEDLE) ×1 IMPLANT
NEEDLE HYPO 25X1 1.5 SAFETY (NEEDLE) ×3 IMPLANT
PACK BASIN DAY SURGERY FS (CUSTOM PROCEDURE TRAY) ×3 IMPLANT
PAD CLEANER CAUTERY TIP 5X5 (MISCELLANEOUS)
PENCIL BUTTON HOLSTER BLD 10FT (ELECTRODE) ×3 IMPLANT
SLEEVE SCD COMPRESS KNEE MED (MISCELLANEOUS) IMPLANT
STRIP CLOSURE SKIN 1/2X4 (GAUZE/BANDAGES/DRESSINGS) IMPLANT
SUCTION FRAZIER HANDLE 10FR (MISCELLANEOUS)
SUCTION TUBE FRAZIER 10FR DISP (MISCELLANEOUS) IMPLANT
SUT ETHILON 3 0 PS 1 (SUTURE) IMPLANT
SUT ETHILON 4 0 PS 2 18 (SUTURE) IMPLANT
SUT MNCRL AB 3-0 PS2 18 (SUTURE) IMPLANT
SUT MNCRL AB 4-0 PS2 18 (SUTURE) IMPLANT
SUT VICRYL 3-0 CR8 SH (SUTURE) ×3 IMPLANT
SUT VICRYL 4-0 PS2 18IN ABS (SUTURE) IMPLANT
SYR CONTROL 10ML LL (SYRINGE) ×3 IMPLANT
TOWEL GREEN STERILE FF (TOWEL DISPOSABLE) ×6 IMPLANT
TUBE CONNECTING 20'X1/4 (TUBING)
TUBE CONNECTING 20X1/4 (TUBING) IMPLANT
YANKAUER SUCT BULB TIP NO VENT (SUCTIONS) IMPLANT

## 2019-01-23 NOTE — Op Note (Signed)
Operative Note  Pre-operative Diagnosis:  Soft tissue mass left posterior shoulder  Post-operative Diagnosis:  same  Surgeon:  Armandina Gemma, MD  Assistant:  none   Procedure:  Excision of soft tissue mass left posterior shoulder, subcutaneous, 7 x 5.5 x 2 cm  Anesthesia:  general  Estimated Blood Loss:  minimal  Drains: none         Specimen: to path  Indications:  Patient is referred by Dr. Lovey Newcomer for surgical evaluation and management of soft tissue mass involving the left shoulder. Patient states that this is been present for several years and gradually increasing in size. She is having some neck and shoulder pain. She was evaluated by her dermatologist and referred to our office for excision.  Procedure Details:  The patient was seen in the pre-op holding area. The risks, benefits, complications, treatment options, and expected outcomes were previously discussed with the patient. The patient agreed with the proposed plan and has signed the informed consent form.  The patient was brought to the operating room by the surgical team, identified as Sharon Wilkinson and the procedure verified. A "time out" was completed and the above information confirmed.  Following administration of general anesthesia, the patient was turned to a right lateral decubitus position.  After ascertaining that an adequate level of anesthesia been achieved and that the patient was properly positioned on the beanbag, an incision was made over the mass on the left posterior shoulder.  Dissection was carried through subcutaneous tissues and hemostasis achieved with the electrocautery.  Soft tissue mass is dissected out circumferentially using the electrocautery.  Dissection is carried down to the underlying muscle fascia.  Mass is separated from the muscle and the fascia and excised in its entirety.  It measures 7 x 5.5 x 2 cm in dimension.  It is submitted to pathology for review.  Good hemostasis is noted  throughout the wound.  Subcutaneous tissues are closed with interrupted 3-0 Vicryl sutures.  Skin is anesthetized with local anesthetic.  Skin is closed with a running 4-0 Monocryl subcuticular suture.  Wound is washed and dried and Dermabond is applied as dressing.  Patient is awakened from anesthesia and transported to the recovery room.  The patient tolerated the procedure well.   Armandina Gemma, MD [facility] Office: [phone]

## 2019-01-23 NOTE — Anesthesia Procedure Notes (Signed)
Procedure Name: LMA Insertion Date/Time: 01/23/2019 11:51 AM Performed by: Bufford Spikes, CRNA Pre-anesthesia Checklist: Patient identified, Emergency Drugs available, Suction available and Patient being monitored Patient Re-evaluated:Patient Re-evaluated prior to induction Oxygen Delivery Method: Circle system utilized Preoxygenation: Pre-oxygenation with 100% oxygen Induction Type: IV induction Ventilation: Mask ventilation without difficulty LMA: LMA inserted LMA Size: 4.0 Number of attempts: 1 Airway Equipment and Method: Bite block Placement Confirmation: positive ETCO2 Tube secured with: Tape Dental Injury: Teeth and Oropharynx as per pre-operative assessment

## 2019-01-23 NOTE — Anesthesia Preprocedure Evaluation (Addendum)
Anesthesia Evaluation  Patient identified by MRN, date of birth, ID band Patient awake    Reviewed: Allergy & Precautions, NPO status , Patient's Chart, lab work & pertinent test results  Airway Mallampati: I  TM Distance: >3 FB Neck ROM: Full    Dental no notable dental hx. (+) Edentulous Upper, Upper Dentures   Pulmonary COPD, Current Smoker and Patient abstained from smoking.,    Pulmonary exam normal breath sounds clear to auscultation       Cardiovascular negative cardio ROS Normal cardiovascular exam Rhythm:Regular Rate:Normal     Neuro/Psych negative neurological ROS  negative psych ROS   GI/Hepatic negative GI ROS, Neg liver ROS,   Endo/Other  negative endocrine ROS  Renal/GU negative Renal ROS  negative genitourinary   Musculoskeletal  (+) Arthritis ,   Abdominal   Peds  Hematology negative hematology ROS (+)   Anesthesia Other Findings   Reproductive/Obstetrics                            Anesthesia Physical Anesthesia Plan  ASA: II  Anesthesia Plan: General   Post-op Pain Management:    Induction: Intravenous  PONV Risk Score and Plan: 2 and Ondansetron and Dexamethasone  Airway Management Planned: LMA and Oral ETT  Additional Equipment:   Intra-op Plan:   Post-operative Plan: Extubation in OR  Informed Consent: I have reviewed the patients History and Physical, chart, labs and discussed the procedure including the risks, benefits and alternatives for the proposed anesthesia with the patient or authorized representative who has indicated his/her understanding and acceptance.     Dental advisory given  Plan Discussed with: CRNA  Anesthesia Plan Comments:         Anesthesia Quick Evaluation

## 2019-01-23 NOTE — Discharge Instructions (Signed)
No Tylenol until 515 pm!   Post Anesthesia Home Care Instructions  Activity: Get plenty of rest for the remainder of the day. A responsible individual must stay with you for 24 hours following the procedure.  For the next 24 hours, DO NOT: -Drive a car -Paediatric nurse -Drink alcoholic beverages -Take any medication unless instructed by your physician -Make any legal decisions or sign important papers.  Meals: Start with liquid foods such as gelatin or soup. Progress to regular foods as tolerated. Avoid greasy, spicy, heavy foods. If nausea and/or vomiting occur, drink only clear liquids until the nausea and/or vomiting subsides. Call your physician if vomiting continues.  Special Instructions/Symptoms: Your throat may feel dry or sore from the anesthesia or the breathing tube placed in your throat during surgery. If this causes discomfort, gargle with warm salt water. The discomfort should disappear within 24 hours.  If you had a scopolamine patch placed behind your ear for the management of post- operative nausea and/or vomiting:  1. The medication in the patch is effective for 72 hours, after which it should be removed.  Wrap patch in a tissue and discard in the trash. Wash hands thoroughly with soap and water. 2. You may remove the patch earlier than 72 hours if you experience unpleasant side effects which may include dry mouth, dizziness or visual disturbances. 3. Avoid touching the patch. Wash your hands with soap and water after contact with the patch.

## 2019-01-23 NOTE — Interval H&P Note (Signed)
History and Physical Interval Note:  01/23/2019 11:29 AM  Sharon Wilkinson  has presented today for surgery, with the diagnosis of SOFT TISSUE MASS.  The various methods of treatment have been discussed with the patient and family. After consideration of risks, benefits and other options for treatment, the patient has consented to    Procedure(s): EXCISION OF SOFT TISSUE MASS LEFT SHOULDER (Left) as a surgical intervention.    The patient's history has been reviewed, patient examined, no change in status, stable for surgery.  I have reviewed the patient's chart and labs.  Questions were answered to the patient's satisfaction.    Armandina Gemma, [place] [place] [facility] Office: [place]

## 2019-01-23 NOTE — Transfer of Care (Signed)
Immediate Anesthesia Transfer of Care Note  Patient: Sharon Wilkinson  Procedure(s) Performed: EXCISION OF SOFT TISSUE MASS LEFT SHOULDER (Left Shoulder)  Patient Location: PACU  Anesthesia Type:General  Level of Consciousness: awake, alert  and oriented  Airway & Oxygen Therapy: Patient Spontanous Breathing and Patient connected to nasal cannula oxygen  Post-op Assessment: Report given to RN and Post -op Vital signs reviewed and stable  Post vital signs: Reviewed and stable  Last Vitals:  Vitals Value Taken Time  BP 136/78 01/23/19 1243  Temp    Pulse 96 01/23/19 1244  Resp 17 01/23/19 1244  SpO2 100 % 01/23/19 1244  Vitals shown include unvalidated device data.  Last Pain:  Vitals:   01/23/19 1104  TempSrc: Oral  PainSc: 0-No pain         Complications: No apparent anesthesia complications

## 2019-01-23 NOTE — Anesthesia Postprocedure Evaluation (Signed)
Anesthesia Post Note  Patient: Sharon Wilkinson  Procedure(s) Performed: EXCISION OF SOFT TISSUE MASS LEFT SHOULDER (Left Shoulder)     Patient location during evaluation: PACU Anesthesia Type: General Level of consciousness: awake and alert Pain management: pain level controlled Vital Signs Assessment: post-procedure vital signs reviewed and stable Respiratory status: spontaneous breathing, nonlabored ventilation, respiratory function stable and patient connected to nasal cannula oxygen Cardiovascular status: blood pressure returned to baseline and stable Postop Assessment: no apparent nausea or vomiting Anesthetic complications: no    Last Vitals:  Vitals:   01/23/19 1300 01/23/19 1315  BP: (!) 144/77 (!) 167/82  Pulse: 85 83  Resp: 16 16  Temp:  36.4 C  SpO2: 99% 97%    Last Pain:  Vitals:   01/23/19 1315  TempSrc:   PainSc: 0-No pain                 Dupree Givler L Ercilia Bettinger

## 2019-01-24 ENCOUNTER — Encounter (HOSPITAL_BASED_OUTPATIENT_CLINIC_OR_DEPARTMENT_OTHER): Payer: Self-pay | Admitting: Surgery

## 2019-01-25 NOTE — Progress Notes (Signed)
Please contact patient and notify of benign pathology results.  Sherly Brodbeck M. Arseniy Toomey, MD, FACS [facility] Office: [phone]   

## 2019-02-06 DIAGNOSIS — M542 Cervicalgia: Secondary | ICD-10-CM | POA: Diagnosis not present

## 2019-03-08 DIAGNOSIS — M542 Cervicalgia: Secondary | ICD-10-CM | POA: Diagnosis not present

## 2019-03-08 DIAGNOSIS — M25512 Pain in left shoulder: Secondary | ICD-10-CM | POA: Diagnosis not present

## 2019-04-10 DIAGNOSIS — M542 Cervicalgia: Secondary | ICD-10-CM | POA: Diagnosis not present

## 2019-04-10 DIAGNOSIS — M25512 Pain in left shoulder: Secondary | ICD-10-CM | POA: Diagnosis not present

## 2019-06-21 ENCOUNTER — Ambulatory Visit: Payer: Medicare Other

## 2019-06-29 ENCOUNTER — Ambulatory Visit: Payer: Medicare Other | Attending: Internal Medicine

## 2019-06-29 DIAGNOSIS — Z23 Encounter for immunization: Secondary | ICD-10-CM | POA: Insufficient documentation

## 2019-06-29 NOTE — Progress Notes (Signed)
   Covid-19 Vaccination Clinic  Name:  Sharon Wilkinson    MRN: [id number] DOB: 1940/07/15  06/29/2019  [name] was observed post Covid-19 immunization for 30 minutes based on pre-vaccination screening without incidence. She was provided with Vaccine Information Sheet and instruction to access the V-Safe system.   [name] was instructed to call 911 with any severe reactions post vaccine: Marland Kitchen Difficulty breathing  . Swelling of your face and throat  . A fast heartbeat  . A bad rash all over your body  . Dizziness and weakness    Immunizations Administered    Name Date Dose VIS Date Route   Pfizer COVID-19 Vaccine 06/29/2019  9:45 AM 0.3 mL 05/05/2019 Intramuscular   Manufacturer: [place]   Lot: [id number]   [place]: [id number]

## 2019-07-12 ENCOUNTER — Ambulatory Visit: Payer: Medicare Other

## 2019-07-24 ENCOUNTER — Ambulatory Visit: Payer: Medicare Other | Attending: Internal Medicine

## 2019-07-24 DIAGNOSIS — Z23 Encounter for immunization: Secondary | ICD-10-CM | POA: Insufficient documentation

## 2019-07-24 NOTE — Progress Notes (Signed)
   Covid-19 Vaccination Clinic  Name:  Sharon Wilkinson    MRN: [id number] DOB: 03-09-1941  07/24/2019  [name] was observed post Covid-19 immunization for 15 minutes without incidence. She was provided with Vaccine Information Sheet and instruction to access the V-Safe system.   [name] was instructed to call 911 with any severe reactions post vaccine: Marland Kitchen Difficulty breathing  . Swelling of your face and throat  . A fast heartbeat  . A bad rash all over your body  . Dizziness and weakness    Immunizations Administered    Name Date Dose VIS Date Route   Pfizer COVID-19 Vaccine 07/24/2019  2:19 PM 0.3 mL 05/05/2019 Intramuscular   Manufacturer: [place]   Lot: [id number]   [place]: [id number]

## 2020-03-19 DIAGNOSIS — Z23 Encounter for immunization: Secondary | ICD-10-CM | POA: Diagnosis not present

## 2023-03-24 ENCOUNTER — Other Ambulatory Visit: Payer: Self-pay | Admitting: Orthopaedic Surgery

## 2023-03-24 DIAGNOSIS — M5416 Radiculopathy, lumbar region: Secondary | ICD-10-CM

## 2023-03-25 ENCOUNTER — Encounter: Payer: Self-pay | Admitting: Orthopaedic Surgery

## 2023-04-12 ENCOUNTER — Ambulatory Visit
Admission: RE | Admit: 2023-04-12 | Discharge: 2023-04-12 | Disposition: A | Discharge status: Home / Self Care | Payer: Medicare Other | Source: Ambulatory Visit | Attending: Orthopaedic Surgery | Admitting: Orthopaedic Surgery

## 2023-04-12 DIAGNOSIS — M5416 Radiculopathy, lumbar region: Secondary | ICD-10-CM

## 2024-05-19 ENCOUNTER — Other Ambulatory Visit: Payer: Self-pay

## 2024-05-19 ENCOUNTER — Emergency Department (HOSPITAL_COMMUNITY)

## 2024-05-19 ENCOUNTER — Inpatient Hospital Stay (HOSPITAL_COMMUNITY)
Admission: EM | Admit: 2024-05-19 | Discharge: 2024-05-24 | DRG: 193 | Disposition: A | Discharge status: Home Health | Attending: Internal Medicine | Admitting: Internal Medicine

## 2024-05-19 ENCOUNTER — Encounter (HOSPITAL_COMMUNITY): Payer: Self-pay

## 2024-05-19 ENCOUNTER — Inpatient Hospital Stay (HOSPITAL_COMMUNITY)

## 2024-05-19 DIAGNOSIS — Z905 Acquired absence of kidney: Secondary | ICD-10-CM | POA: Diagnosis not present

## 2024-05-19 DIAGNOSIS — I110 Hypertensive heart disease with heart failure: Secondary | ICD-10-CM | POA: Diagnosis present

## 2024-05-19 DIAGNOSIS — J9601 Acute respiratory failure with hypoxia: Secondary | ICD-10-CM | POA: Diagnosis present

## 2024-05-19 DIAGNOSIS — Z681 Body mass index (BMI) 19 or less, adult: Secondary | ICD-10-CM | POA: Diagnosis not present

## 2024-05-19 DIAGNOSIS — Z79899 Other long term (current) drug therapy: Secondary | ICD-10-CM | POA: Diagnosis not present

## 2024-05-19 DIAGNOSIS — D751 Secondary polycythemia: Secondary | ICD-10-CM | POA: Diagnosis present

## 2024-05-19 DIAGNOSIS — R911 Solitary pulmonary nodule: Secondary | ICD-10-CM | POA: Diagnosis present

## 2024-05-19 DIAGNOSIS — J441 Chronic obstructive pulmonary disease with (acute) exacerbation: Secondary | ICD-10-CM | POA: Diagnosis present

## 2024-05-19 DIAGNOSIS — Z8249 Family history of ischemic heart disease and other diseases of the circulatory system: Secondary | ICD-10-CM | POA: Diagnosis not present

## 2024-05-19 DIAGNOSIS — I5032 Chronic diastolic (congestive) heart failure: Secondary | ICD-10-CM | POA: Diagnosis present

## 2024-05-19 DIAGNOSIS — R634 Abnormal weight loss: Secondary | ICD-10-CM | POA: Diagnosis present

## 2024-05-19 DIAGNOSIS — Z85528 Personal history of other malignant neoplasm of kidney: Secondary | ICD-10-CM

## 2024-05-19 DIAGNOSIS — E872 Acidosis, unspecified: Secondary | ICD-10-CM | POA: Diagnosis present

## 2024-05-19 DIAGNOSIS — Z1152 Encounter for screening for COVID-19: Secondary | ICD-10-CM

## 2024-05-19 DIAGNOSIS — Z7982 Long term (current) use of aspirin: Secondary | ICD-10-CM | POA: Diagnosis not present

## 2024-05-19 DIAGNOSIS — K529 Noninfective gastroenteritis and colitis, unspecified: Secondary | ICD-10-CM | POA: Diagnosis present

## 2024-05-19 DIAGNOSIS — J189 Pneumonia, unspecified organism: Secondary | ICD-10-CM | POA: Diagnosis present

## 2024-05-19 DIAGNOSIS — R9431 Abnormal electrocardiogram [ECG] [EKG]: Secondary | ICD-10-CM

## 2024-05-19 DIAGNOSIS — J440 Chronic obstructive pulmonary disease with acute lower respiratory infection: Secondary | ICD-10-CM | POA: Diagnosis present

## 2024-05-19 DIAGNOSIS — I700 Atherosclerosis of aorta: Secondary | ICD-10-CM | POA: Diagnosis present

## 2024-05-19 DIAGNOSIS — I160 Hypertensive urgency: Secondary | ICD-10-CM | POA: Diagnosis present

## 2024-05-19 DIAGNOSIS — R739 Hyperglycemia, unspecified: Secondary | ICD-10-CM | POA: Diagnosis present

## 2024-05-19 DIAGNOSIS — F17210 Nicotine dependence, cigarettes, uncomplicated: Secondary | ICD-10-CM | POA: Diagnosis present

## 2024-05-19 DIAGNOSIS — T380X5A Adverse effect of glucocorticoids and synthetic analogues, initial encounter: Secondary | ICD-10-CM | POA: Diagnosis present

## 2024-05-19 DIAGNOSIS — E43 Unspecified severe protein-calorie malnutrition: Secondary | ICD-10-CM | POA: Diagnosis present

## 2024-05-19 DIAGNOSIS — J449 Chronic obstructive pulmonary disease, unspecified: Principal | ICD-10-CM

## 2024-05-19 DIAGNOSIS — Z716 Tobacco abuse counseling: Secondary | ICD-10-CM

## 2024-05-19 DIAGNOSIS — R7989 Other specified abnormal findings of blood chemistry: Secondary | ICD-10-CM | POA: Diagnosis not present

## 2024-05-19 DIAGNOSIS — Z720 Tobacco use: Secondary | ICD-10-CM | POA: Diagnosis present

## 2024-05-19 DIAGNOSIS — E860 Dehydration: Secondary | ICD-10-CM | POA: Diagnosis present

## 2024-05-19 DIAGNOSIS — R636 Underweight: Secondary | ICD-10-CM | POA: Diagnosis present

## 2024-05-19 DIAGNOSIS — I10 Essential (primary) hypertension: Secondary | ICD-10-CM

## 2024-05-19 LAB — COMPREHENSIVE METABOLIC PANEL WITH GFR
ALT: 17 U/L (ref 0–44)
AST: 30 U/L (ref 15–41)
Albumin: 3.8 g/dL (ref 3.5–5.0)
Alkaline Phosphatase: 121 U/L (ref 38–126)
Anion gap: 13 (ref 5–15)
BUN: 18 mg/dL (ref 8–23)
CO2: 28 mmol/L (ref 22–32)
Calcium: 9.7 mg/dL (ref 8.9–10.3)
Chloride: 98 mmol/L (ref 98–111)
Creatinine, Ser: 0.63 mg/dL (ref 0.44–1.00)
GFR, Estimated: >60 mL/min
Glucose, Bld: 125 mg/dL — ABNORMAL HIGH (ref 70–99)
Potassium: 3.7 mmol/L (ref 3.5–5.1)
Sodium: 139 mmol/L (ref 135–145)
Total Bilirubin: 0.6 mg/dL (ref 0.0–1.2)
Total Protein: 8.1 g/dL (ref 6.5–8.1)

## 2024-05-19 LAB — CBC
HCT: 53.7 % — ABNORMAL HIGH (ref 36.0–46.0)
Hemoglobin: 17.3 g/dL — ABNORMAL HIGH (ref 12.0–15.0)
MCH: 30.5 pg (ref 26.0–34.0)
MCHC: 32.2 g/dL (ref 30.0–36.0)
MCV: 94.5 fL (ref 80.0–100.0)
Platelets: 271 K/uL (ref 150–400)
RBC: 5.68 MIL/uL — ABNORMAL HIGH (ref 3.87–5.11)
RDW: 13.1 % (ref 11.5–15.5)
WBC: 13.7 K/uL — ABNORMAL HIGH (ref 4.0–10.5)
nRBC: 0 % (ref 0.0–0.2)

## 2024-05-19 LAB — LIPID PANEL
Cholesterol: 166 mg/dL (ref 0–200)
HDL: 68 mg/dL
LDL Cholesterol: 79 mg/dL (ref 0–99)
Total CHOL/HDL Ratio: 2.5 ratio
Triglycerides: 100 mg/dL
VLDL: 20 mg/dL (ref 0–40)

## 2024-05-19 LAB — PHOSPHORUS: Phosphorus: 2.5 mg/dL (ref 2.5–4.6)

## 2024-05-19 LAB — TROPONIN T, HIGH SENSITIVITY: Troponin T High Sensitivity: 19 ng/L (ref 0–19)

## 2024-05-19 LAB — ECHOCARDIOGRAM COMPLETE
Area-P 1/2: 5.97 cm2
Height: 65 in
S' Lateral: 2.5 cm
Weight: 1600 [oz_av]

## 2024-05-19 LAB — PROCALCITONIN: Procalcitonin: 0.11 ng/mL

## 2024-05-19 LAB — PRO BRAIN NATRIURETIC PEPTIDE: Pro Brain Natriuretic Peptide: 1830 pg/mL — ABNORMAL HIGH

## 2024-05-19 LAB — MAGNESIUM: Magnesium: 3.8 mg/dL — ABNORMAL HIGH (ref 1.7–2.4)

## 2024-05-19 MED ORDER — IPRATROPIUM-ALBUTEROL 0.5-2.5 (3) MG/3ML IN SOLN
3.0000 mL | Freq: Once | RESPIRATORY_TRACT | Status: AC
  Administered 2024-05-19: 3 mL via RESPIRATORY_TRACT
  Filled 2024-05-19: qty 3

## 2024-05-19 MED ORDER — ENOXAPARIN SODIUM 40 MG/0.4ML IJ SOSY: 40.0000 mg | PREFILLED_SYRINGE | INTRAMUSCULAR | Status: DC

## 2024-05-19 MED ORDER — SODIUM CHLORIDE 0.9 % IV SOLN
1.0000 g | Freq: Once | INTRAVENOUS | Status: AC
  Administered 2024-05-19: 1 g via INTRAVENOUS
  Filled 2024-05-19: qty 10

## 2024-05-19 MED ORDER — NICOTINE 14 MG/24HR TD PT24
14.0000 mg | MEDICATED_PATCH | Freq: Every day | TRANSDERMAL | Status: DC | PRN
  Filled 2024-05-19 (×2): qty 1

## 2024-05-19 MED ORDER — MAGNESIUM SULFATE IN D5W 1-5 GM/100ML-% IV SOLN
1.0000 g | Freq: Once | INTRAVENOUS | Status: AC
  Administered 2024-05-19: 1 g via INTRAVENOUS
  Filled 2024-05-19: qty 100

## 2024-05-19 MED ORDER — HYDRALAZINE HCL 20 MG/ML IJ SOLN: 10.0000 mg | Freq: Once | INTRAMUSCULAR | Status: DC

## 2024-05-19 MED ORDER — AZITHROMYCIN 250 MG PO TABS
500.0000 mg | ORAL_TABLET | Freq: Once | ORAL | Status: AC
  Administered 2024-05-19: 500 mg via ORAL
  Filled 2024-05-19: qty 2

## 2024-05-19 MED ORDER — SODIUM CHLORIDE 0.9 % IV SOLN
1.0000 g | INTRAVENOUS | Status: DC
  Administered 2024-05-20: 1 g via INTRAVENOUS
  Filled 2024-05-19: qty 10

## 2024-05-19 MED ORDER — ENOXAPARIN SODIUM 30 MG/0.3ML IJ SOSY
30.0000 mg | PREFILLED_SYRINGE | INTRAMUSCULAR | Status: DC
  Administered 2024-05-19: 30 mg via SUBCUTANEOUS
  Filled 2024-05-19: qty 0.3

## 2024-05-19 MED ORDER — HYDRALAZINE HCL 20 MG/ML IJ SOLN
7.5000 mg | INTRAMUSCULAR | Status: DC | PRN
  Administered 2024-05-20 (×2): 8 mg via INTRAVENOUS
  Filled 2024-05-19 (×2): qty 1

## 2024-05-19 MED ORDER — AMLODIPINE BESYLATE 5 MG PO TABS
5.0000 mg | ORAL_TABLET | Freq: Once | ORAL | Status: AC
  Administered 2024-05-19: 5 mg via ORAL
  Filled 2024-05-19: qty 1

## 2024-05-19 MED ORDER — ASPIRIN 81 MG PO TBEC
81.0000 mg | DELAYED_RELEASE_TABLET | ORAL | Status: DC
  Administered 2024-05-19 – 2024-05-24 (×3): 81 mg via ORAL
  Filled 2024-05-19 (×2): qty 1

## 2024-05-19 MED ORDER — PREDNISONE 20 MG PO TABS
20.0000 mg | ORAL_TABLET | Freq: Every day | ORAL | Status: DC
  Administered 2024-05-20: 20 mg via ORAL
  Filled 2024-05-19: qty 1

## 2024-05-19 MED ORDER — HYDRALAZINE HCL 20 MG/ML IJ SOLN
5.0000 mg | Freq: Once | INTRAMUSCULAR | Status: AC
  Administered 2024-05-19: 5 mg via INTRAVENOUS
  Filled 2024-05-19: qty 1

## 2024-05-19 MED ORDER — SODIUM CHLORIDE 0.9 % IV SOLN
500.0000 mg | INTRAVENOUS | Status: DC
  Administered 2024-05-20 – 2024-05-21 (×2): 500 mg via INTRAVENOUS
  Filled 2024-05-19 (×4): qty 5

## 2024-05-19 MED ORDER — AMLODIPINE BESYLATE 5 MG PO TABS
2.5000 mg | ORAL_TABLET | Freq: Every day | ORAL | Status: DC
  Administered 2024-05-20: 2.5 mg via ORAL
  Filled 2024-05-19: qty 1

## 2024-05-19 MED ORDER — METHYLPREDNISOLONE SODIUM SUCC 125 MG IJ SOLR
125.0000 mg | Freq: Once | INTRAMUSCULAR | Status: AC
  Administered 2024-05-19: 125 mg via INTRAVENOUS
  Filled 2024-05-19: qty 2

## 2024-05-19 MED ORDER — ASPIRIN 81 MG PO TBEC: 81.0000 mg | DELAYED_RELEASE_TABLET | Freq: Every day | ORAL | Status: DC

## 2024-05-19 MED ORDER — POTASSIUM CHLORIDE CRYS ER 20 MEQ PO TBCR
20.0000 meq | EXTENDED_RELEASE_TABLET | Freq: Four times a day (QID) | ORAL | Status: AC
  Administered 2024-05-19 (×2): 20 meq via ORAL
  Filled 2024-05-19 (×2): qty 1

## 2024-05-19 MED ORDER — ENOXAPARIN SODIUM 30 MG/0.3ML IJ SOSY: 30.0000 mg | PREFILLED_SYRINGE | INTRAMUSCULAR | Status: DC

## 2024-05-19 NOTE — ED Provider Notes (Signed)
 " [place] EMERGENCY DEPARTMENT AT [facility] Provider Note   CSN: [id number] Arrival date & time: 05/19/24  9094     Patient presents with: Cough   Sharon Wilkinson is a 83 y.o. female.    Cough    Patient has a history of COPD, chronic smoking, kidney cancer.  Patient states she does not see a doctor regularly.  Medical records indicate history of COPD but patient denies having that diagnosis to me.  Patient does not know the last time she saw doctor for checkup.  Medical records show an office visit back in 2014.  Patient states she has been feeling poorly for the last several days.  She has had a cough.  She has been having bodyaches.  She also reports diarrhea but that is something that has been ongoing for months.  She went to an urgent care today to be evaluated.  They did COVID and flu testing that was negative.  They noted her oxygen level was low and her blood pressure was high so she was sent to the ED for further evaluation  Prior to Admission medications  Medication Sig Start Date End Date Taking? Authorizing Provider  acetaminophen  (TYLENOL ) 325 MG tablet Take 650 mg by mouth every 6 (six) hours as needed.    [provider]  traMADol  (ULTRAM ) 50 MG tablet Take 1-2 tablets (50-100 mg total) by mouth every 6 (six) hours as needed for moderate pain or severe pain. 01/23/19   Eletha Boas, MD    Allergies: Patient has no known allergies.    Review of Systems  Respiratory:  Positive for cough.     Updated Vital Signs BP (!) 225/116   Pulse 87   Temp 98.7 F (37.1 C) (Oral)   Resp (!) 27   Ht 1.651 m (5' 5)   Wt 45.4 kg   SpO2 93%   BMI 16.64 kg/m   Physical Exam Vitals and nursing note reviewed.  Constitutional:      Appearance: She is well-developed. She is not diaphoretic.  HENT:     Head: Normocephalic and atraumatic.     Right Ear: External ear normal.     Left Ear: External ear normal.  Eyes:     General: No scleral icterus.        Right eye: No discharge.        Left eye: No discharge.     Conjunctiva/sclera: Conjunctivae normal.  Neck:     Trachea: No tracheal deviation.  Cardiovascular:     Rate and Rhythm: Normal rate and regular rhythm.  Pulmonary:     Effort: Pulmonary effort is normal. No respiratory distress.     Breath sounds: No stridor. Wheezing and rhonchi present. No rales.  Abdominal:     General: Bowel sounds are normal. There is no distension.     Palpations: Abdomen is soft.     Tenderness: There is no abdominal tenderness. There is no guarding or rebound.  Musculoskeletal:        General: No tenderness or deformity.     Cervical back: Neck supple.  Skin:    General: Skin is warm and dry.     Findings: No rash.  Neurological:     General: No focal deficit present.     Mental Status: She is alert.     Cranial Nerves: No cranial nerve deficit, dysarthria or facial asymmetry.     Sensory: No sensory deficit.     Motor: No abnormal  muscle tone or seizure activity.     Coordination: Coordination normal.  Psychiatric:        Mood and Affect: Mood normal.     (all labs ordered are listed, but only abnormal results are displayed) Labs Reviewed  COMPREHENSIVE METABOLIC PANEL WITH GFR - Abnormal; Notable for the following components:      Result Value   Glucose, Bld 125 (*)    All other components within normal limits  CBC - Abnormal; Notable for the following components:   WBC 13.7 (*)    RBC 5.68 (*)    Hemoglobin 17.3 (*)    HCT 53.7 (*)    All other components within normal limits  PRO BRAIN NATRIURETIC PEPTIDE - Abnormal; Notable for the following components:   Pro Brain Natriuretic Peptide 1,830.0 (*)    All other components within normal limits    EKG: EKG Interpretation Date/Time:  Friday May 19 2024 09:54:06 EST Ventricular Rate:  91 PR Interval:  123 QRS Duration:  76 QT Interval:  442 QTC Calculation: 529 R Axis:   -86  Text Interpretation: Sinus rhythm Biatrial  enlargement Left anterior fascicular block Abnormal R-wave progression, early transition Probable left ventricular hypertrophy Nonspecific T abnormalities, inferior leads qt increased since last tracing Confirmed by Randol Simmonds [id number]) on 05/19/2024 10:50:35 AM  Radiology: [facility] Chest Port 1 View Result Date: 05/19/2024 EXAM: 1 VIEW(S) XRAY OF THE CHEST 05/19/2024 COMPARISON: Chest radiographs 01/22/2013 and earlier. CLINICAL HISTORY: 83 year old female with cough. FINDINGS: LUNGS AND PLEURA: Large lung volumes, chronic hyperinflation. Streaky and confluent right lower lung peribronchial opacity, consistent with bronchopneumonia. No pleural effusion. No pneumothorax. HEART AND MEDIASTINUM: Atherosclerotic aortic arch calcifications. No acute abnormality of the cardiac silhouette. BONES AND SOFT TISSUES: No acute osseous abnormality. IMPRESSION: 1. Right basilar opacity suspicious for bronchopneumonia. 2. Chronic hyperinflation. Electronically signed by: Helayne Hurst MD 05/19/2024 11:18 AM EST RP Workstation: [id number]     .Critical Care  Performed by: Randol Simmonds, MD Authorized by: Randol Simmonds, MD   Critical care provider statement:    Critical care time (minutes):  35   Critical care was time spent personally by me on the following activities:  Development of treatment plan with patient or surrogate, discussions with consultants, evaluation of patient's response to treatment, examination of patient, ordering and review of laboratory studies, ordering and review of radiographic studies, ordering and performing treatments and interventions, pulse oximetry, re-evaluation of patient's condition and review of old charts    Medications Ordered in the ED  amLODipine  (NORVASC ) tablet 5 mg (has no administration in time range)  cefTRIAXone  (ROCEPHIN ) 1 g in sodium chloride  0.9 % 100 mL IVPB (has no administration in time range)  azithromycin  (ZITHROMAX ) tablet 500 mg (has no administration in time range)   hydrALAZINE  (APRESOLINE ) injection 10 mg (has no administration in time range)  methylPREDNISolone  sodium succinate (SOLU-MEDROL ) 125 mg/2 mL injection 125 mg (125 mg Intravenous Given 05/19/24 0954)  ipratropium-albuterol  (DUONEB) 0.5-2.5 (3) MG/3ML nebulizer solution 3 mL (3 mLs Nebulization Given 05/19/24 0955)  amLODipine  (NORVASC ) tablet 5 mg (5 mg Oral Given 05/19/24 1001)    Clinical Course as of 05/19/24 1134  Fri May 19, 2024  1049 CBC(!) Blood cell count elevated [JK]  1059 Pro Brain natriuretic peptide(!) BNP elevated 1800 [JK]  1123 DG Chest Port 1 View Chest x-ray shows findings suggestive of pneumonia. [JK]    Clinical Course User Index [JK] Randol Simmonds, MD  Medical Decision Making Torrential diagnosis includes but not limited to COPD, pneumonia, CHF  Problems Addressed: Chronic obstructive pulmonary disease, unspecified COPD type (HCC): acute illness or injury that poses a threat to life or bodily functions Pneumonia due to infectious organism, unspecified laterality, unspecified part of lung: acute illness or injury that poses a threat to life or bodily functions Uncontrolled hypertension: undiagnosed new problem with uncertain prognosis  Amount and/or Complexity of Data Reviewed Labs: ordered. Decision-making details documented in ED Course. Radiology: ordered and independent interpretation performed. Decision-making details documented in ED Course.  Risk Prescription drug management. Decision regarding hospitalization.   Patient presented to ED for evaluation of shortness of breath cough congestion.  Patient does not see a primary care doctor.  She has not seen one for years.  Patient notably hypertensive in the ED.  Suspect this might be more chronic in nature but cannot exclude that this is contributing to her shortness of breath.  Patient symptoms are more suggestive of infectious nature.  She has been having cough congestion  myalgias.  Her x-ray does show pneumonia.  Patient does have a new oxygen requirement.  Her x-ray also shows findings consistent with COPD.  Patient is a chronic smoker.  Suspect this is contributing to her new oxygen requirement.  Will start the patient on IV antibiotics.  She was also given steroids and a breathing treatment.  Patient continues to have elevated blood pressure.  Will give additional meds.  Low suspicion for acute CHF although her BNP is elevated.  Will benefit from further evaluation and possible echocardiogram.  Case discussed with Dr. Celinda regarding admission to hospital for further treatment     Final diagnoses:  Chronic obstructive pulmonary disease, unspecified COPD type (HCC)  Pneumonia due to infectious organism, unspecified laterality, unspecified part of lung  Uncontrolled hypertension    ED Discharge Orders     None          Randol Simmonds, MD 05/19/24 1136  "

## 2024-05-19 NOTE — Plan of Care (Signed)

## 2024-05-19 NOTE — ED Notes (Signed)
 Increased O2 to 3 lpm

## 2024-05-19 NOTE — ED Triage Notes (Signed)
 Headache and non-productive cough, diarrhea, for 5 days. Pt went to UC and they referred pt here for low O2 sat and hypertension, O2 sat is 91% on RA on arrival. Respirations equal and unlabored.

## 2024-05-19 NOTE — ED Notes (Signed)
 Pt oxygen stated 88% informed the nurse

## 2024-05-19 NOTE — H&P (Signed)
 " History and Physical    Patient: Sharon Wilkinson [id number] DOB: [birth year] DOA: 05/19/2024 DOS: the patient was seen and examined on 05/19/2024 PCP: Patient, No Pcp Per  Patient coming from: Home  Chief Complaint:  Chief Complaint  Patient presents with   Cough   HPI: [name] is a 83 y.o. female with medical history significant of osteoarthritis of multiple sites, right kidney cancer, right nephrectomy, left shoulder soft tissue mass, active smoker, COPD who was sent from the urgent care center to the emergency department due to nonproductive cough, dyspnea and fatigue for the past 5 days.  However, she was having severe hypertension and was referred to the emergency department.  She has also been having chronic diarrhea, but he has gotten worse in the last 5 days.  She denied fever, chills, rhinorrhea, sore throat, wheezing or hemoptysis.  No chest pain, palpitations, diaphoresis, PND, orthopnea or pitting edema of the lower extremities.  No abdominal pain, nausea, emesis, constipation, melena or hematochezia.  No flank pain, dysuria, frequency or hematuria.  No polyuria, polydipsia, polyphagia or blurred vision.   Lab work: CBC showed a white count of 13.7, hemoglobin 17.3 g/dL and platelets 728.  proBNP was 1830.0 pg/mL.  CMP was unremarkable with the assassin of a nonfasting glucose of 125 mg/dL.  Imaging: Portable 1 view chest radiograph showing normal heart size, right basilar opacity suspicious for bronchopneumonia.  Chronic hyperinflation.   ED course: Initial vital signs were temperature 98.7 F, pulse 72, respiration 18, BP 190/112 mmHg and O2 sat 91% on room air.  The patient subsequently had hypoxia going down to 84% on room air.  She was placed on nasal cannula oxygen and her most recent O2 sat was 93% on [place] at 2 LPM.  She received 125 mg of Solu-Medrol  and a DuoNeb.  Review of Systems: As mentioned in the history of present illness. All other systems reviewed and are  negative. Past Medical History:  Diagnosis Date   Arthritis    all over   Cancer [facility])    rt kidney cancer   COPD (chronic obstructive pulmonary disease) (HCC)    smoker   Smoker    3/4-1 ppd   Soft tissue mass    left shoulder   Past Surgical History:  Procedure Laterality Date   BACK SURGERY     KNEE ARTHROSCOPY Right    MASS EXCISION Left 01/23/2019   Procedure: EXCISION OF SOFT TISSUE MASS LEFT SHOULDER;  Surgeon: Eletha Boas, MD;  Location: [facility];  Service: General;  Laterality: Left;   NEPHRECTOMY Right    for cancer   ORIF HIP FRACTURE Right    SPINE SURGERY     TRIGGER FINGER RELEASE Right    Social History:  reports that she has been smoking cigarettes. She started smoking about 41 years ago. She has a 31 pack-year smoking history. She has never used smokeless tobacco. She reports that she does not drink alcohol and does not use drugs.  Allergies[1]  Family History  Problem Relation Age of Onset   Heart disease Mother    Heart disease Father     Prior to Admission medications  Medication Sig Start Date End Date Taking? Authorizing Provider  acetaminophen  (TYLENOL ) 325 MG tablet Take 650 mg by mouth every 6 (six) hours as needed.    [provider]  traMADol  (ULTRAM ) 50 MG tablet Take 1-2 tablets (50-100 mg total) by mouth every 6 (six) hours as needed for  moderate pain or severe pain. 01/23/19   Eletha Boas, MD    Physical Exam: Vitals:   05/19/24 0911 05/19/24 0914 05/19/24 1100 05/19/24 1115  BP: (!) 190/112  (!) 183/97 (!) 225/116  Pulse: (!) 102  84 87  Resp: 18  (!) 25 (!) 27  Temp: 98.7 F (37.1 C)     TempSrc: Oral     SpO2: 91%  (!) 84% 93%  Weight:  45.4 kg    Height:  5' 5 (1.651 m)     Physical Exam Vitals and nursing note reviewed.  Constitutional:      General: She is awake. She is not in acute distress.    Appearance: Normal appearance. She is underweight. She is ill-appearing.     Interventions: Nasal  cannula in place.  HENT:     Head: Normocephalic.     Nose: No rhinorrhea.     Mouth/Throat:     Mouth: Mucous membranes are moist.  Eyes:     General: No scleral icterus.    Pupils: Pupils are equal, round, and reactive to light.  Neck:     Vascular: No JVD.  Cardiovascular:     Rate and Rhythm: Normal rate and regular rhythm.     Heart sounds: S1 normal and S2 normal.  Pulmonary:     Effort: No tachypnea, accessory muscle usage or respiratory distress.     Breath sounds: Wheezing, rhonchi and rales present.  Abdominal:     General: Bowel sounds are normal. There is no distension.     Palpations: Abdomen is soft.     Tenderness: There is no abdominal tenderness. There is no right CVA tenderness or left CVA tenderness.  Musculoskeletal:     Cervical back: Neck supple.     Right lower leg: No edema.     Left lower leg: No edema.  Skin:    General: Skin is warm and dry.  Neurological:     General: No focal deficit present.     Mental Status: She is alert and oriented to person, place, and time.  Psychiatric:        Mood and Affect: Mood normal.        Behavior: Behavior normal. Behavior is cooperative.     Data Reviewed:  Results are pending, will review when available.  EKG: Vent. rate 91 BPM  PR interval 123 ms  QRS duration 76 ms  QT/QTcB 442/529 ms  P-R-T axes 77 -86 20  Sinus rhythm  Biatrial enlargement  Left anterior fascicular block  Abnormal R-wave progression, early transition  Probable left ventricular hypertrophy  Nonspecific T abnormalities, inferior leads   Assessment and Plan: Principal Problem:   Acute respiratory failure with hypoxia (HCC) In the setting of:   COPD with acute exacerbation (HCC)  Due to:   CAP (community acquired pneumonia)  Admit to PCU/inpatient. Continue supplemental oxygen. Scheduled and as needed bronchodilators. Continue ceftriaxone  1 g IVPB daily. Continue azithromycin  500 mg IVPB daily. Check strep pneumoniae  urinary antigen. Check sputum Gram stain, culture and sensitivity. Follow-up blood culture and sensitivity. Follow-up CBC and chemistry in the morning.  Active Problems:   Unintentional weight loss   Chronic diarrhea   Underweight Has lost 35 pounds in the last year.    Hypertensive urgency No previous hypertension history. Responded to hydralazine  and amlodipine  5 mg p.o. x 1 dose. I will continue amlodipine  2.5 mg p.o. daily given her current weight. -Then increase to 5 mg if needed.  Tobacco use Tobacco cessation advised. Nicotine  replacement therapy may be ordered PRN.    Hyperglycemia Check fasting glucose in AM. Further workup depending on results.    Polycythemia secondary to smoking Smoking cessation advised. Begin EC aspirin  81 mg 3 times weekly.    Elevated brain natriuretic peptide (BNP) level However, no cardiomegaly or edema. Obtain echocardiogram.    Aortic atherosclerosis Check fasting lipids.    Advance Care Planning:   Code Status: Full Code   Consults:   Family Communication:   Severity of Illness: The appropriate patient status for this patient is INPATIENT. Inpatient status is judged to be reasonable and necessary in order to provide the required intensity of service to ensure the patient's safety. The patient's presenting symptoms, physical exam findings, and initial radiographic and laboratory data in the context of their chronic comorbidities is felt to place them at high risk for further clinical deterioration. Furthermore, it is not anticipated that the patient will be medically stable for discharge from the hospital within 2 midnights of admission.   * I certify that at the point of admission it is my clinical judgment that the patient will require inpatient hospital care spanning beyond 2 midnights from the point of admission due to high intensity of service, high risk for further deterioration and high frequency of surveillance  required.*  Author: Alm Dorn Castor, MD 05/19/2024 11:31 AM  For on call review [email or web address].   This document was prepared using Dragon voice recognition software and may contain some unintended transcription errors.     [1] No Known Allergies  "

## 2024-05-20 ENCOUNTER — Inpatient Hospital Stay (HOSPITAL_COMMUNITY)

## 2024-05-20 DIAGNOSIS — J189 Pneumonia, unspecified organism: Secondary | ICD-10-CM | POA: Diagnosis not present

## 2024-05-20 DIAGNOSIS — Z905 Acquired absence of kidney: Secondary | ICD-10-CM

## 2024-05-20 DIAGNOSIS — R634 Abnormal weight loss: Secondary | ICD-10-CM

## 2024-05-20 DIAGNOSIS — K529 Noninfective gastroenteritis and colitis, unspecified: Secondary | ICD-10-CM | POA: Diagnosis not present

## 2024-05-20 DIAGNOSIS — J9601 Acute respiratory failure with hypoxia: Secondary | ICD-10-CM | POA: Diagnosis not present

## 2024-05-20 DIAGNOSIS — I5032 Chronic diastolic (congestive) heart failure: Secondary | ICD-10-CM | POA: Diagnosis present

## 2024-05-20 DIAGNOSIS — J441 Chronic obstructive pulmonary disease with (acute) exacerbation: Secondary | ICD-10-CM | POA: Diagnosis not present

## 2024-05-20 LAB — COMPREHENSIVE METABOLIC PANEL WITH GFR
ALT: 20 U/L (ref 0–44)
AST: 35 U/L (ref 15–41)
Albumin: 3.6 g/dL (ref 3.5–5.0)
Alkaline Phosphatase: 101 U/L (ref 38–126)
Anion gap: 9 (ref 5–15)
BUN: 24 mg/dL — ABNORMAL HIGH (ref 8–23)
CO2: 31 mmol/L (ref 22–32)
Calcium: 9.5 mg/dL (ref 8.9–10.3)
Chloride: 99 mmol/L (ref 98–111)
Creatinine, Ser: 0.58 mg/dL (ref 0.44–1.00)
GFR, Estimated: >60 mL/min
Glucose, Bld: 180 mg/dL — ABNORMAL HIGH (ref 70–99)
Potassium: 4.6 mmol/L (ref 3.5–5.1)
Sodium: 139 mmol/L (ref 135–145)
Total Bilirubin: 0.4 mg/dL (ref 0.0–1.2)
Total Protein: 7.6 g/dL (ref 6.5–8.1)

## 2024-05-20 LAB — CBC
HCT: 47.7 % — ABNORMAL HIGH (ref 36.0–46.0)
Hemoglobin: 15.6 g/dL — ABNORMAL HIGH (ref 12.0–15.0)
MCH: 30.4 pg (ref 26.0–34.0)
MCHC: 32.7 g/dL (ref 30.0–36.0)
MCV: 93 fL (ref 80.0–100.0)
Platelets: 282 K/uL (ref 150–400)
RBC: 5.13 MIL/uL — ABNORMAL HIGH (ref 3.87–5.11)
RDW: 13 % (ref 11.5–15.5)
WBC: 16.3 K/uL — ABNORMAL HIGH (ref 4.0–10.5)
nRBC: 0 % (ref 0.0–0.2)

## 2024-05-20 LAB — RESPIRATORY PANEL BY PCR

## 2024-05-20 LAB — SARS CORONAVIRUS 2 BY RT PCR: SARS Coronavirus 2 by RT PCR: NEGATIVE

## 2024-05-20 LAB — PROCALCITONIN: Procalcitonin: 0.11 ng/mL

## 2024-05-20 LAB — PREALBUMIN: Prealbumin: 7 mg/dL — ABNORMAL LOW (ref 18–38)

## 2024-05-20 MED ORDER — AMLODIPINE BESYLATE 5 MG PO TABS
5.0000 mg | ORAL_TABLET | Freq: Every day | ORAL | Status: DC
  Administered 2024-05-21: 5 mg via ORAL
  Filled 2024-05-20 (×4): qty 1

## 2024-05-20 MED ORDER — ACETAMINOPHEN 325 MG PO TABS
325.0000 mg | ORAL_TABLET | Freq: Every evening | ORAL | Status: DC | PRN
  Administered 2024-05-20 – 2024-05-23 (×4): 325 mg via ORAL
  Filled 2024-05-20 (×5): qty 1

## 2024-05-20 MED ORDER — PREDNISONE 50 MG PO TABS
50.0000 mg | ORAL_TABLET | Freq: Every day | ORAL | Status: DC
  Administered 2024-05-21 – 2024-05-23 (×3): 50 mg via ORAL
  Filled 2024-05-20 (×3): qty 1

## 2024-05-20 MED ORDER — CARVEDILOL 3.125 MG PO TABS: 3.1250 mg | ORAL_TABLET | Freq: Two times a day (BID) | ORAL | Status: DC

## 2024-05-20 MED ORDER — AMLODIPINE BESYLATE 10 MG PO TABS
5.0000 mg | ORAL_TABLET | Freq: Every day | ORAL | Status: DC
  Filled 2024-05-20: qty 0.5

## 2024-05-20 MED ORDER — ENSURE PLUS HIGH PROTEIN PO LIQD
237.0000 mL | Freq: Three times a day (TID) | ORAL | Status: DC
  Administered 2024-05-20 – 2024-05-24 (×9): 237 mL via ORAL
  Filled 2024-05-20 (×9): qty 237

## 2024-05-20 MED ORDER — AMLODIPINE BESYLATE 5 MG PO TABS: 5.0000 mg | ORAL_TABLET | Freq: Once | ORAL | Status: DC

## 2024-05-20 MED ORDER — METHYLPREDNISOLONE SODIUM SUCC 40 MG IJ SOLR: 40.0000 mg | Freq: Once | INTRAMUSCULAR | Status: DC

## 2024-05-20 MED ORDER — CARVEDILOL 3.125 MG PO TABS: 3.1250 mg | ORAL_TABLET | Freq: Two times a day (BID) | ORAL | Status: DC | PRN

## 2024-05-20 MED ORDER — IPRATROPIUM-ALBUTEROL 0.5-2.5 (3) MG/3ML IN SOLN
3.0000 mL | RESPIRATORY_TRACT | Status: DC | PRN
  Administered 2024-05-20 – 2024-05-22 (×3): 3 mL via RESPIRATORY_TRACT
  Filled 2024-05-20 (×10): qty 3

## 2024-05-20 MED ORDER — ACETAMINOPHEN 325 MG PO TABS: 325.0000 mg | ORAL_TABLET | Freq: Every evening | ORAL | Status: DC | PRN

## 2024-05-20 MED ORDER — AMLODIPINE BESYLATE 5 MG PO TABS
5.0000 mg | ORAL_TABLET | Freq: Once | ORAL | Status: AC
  Administered 2024-05-20: 5 mg via ORAL

## 2024-05-20 MED ORDER — IPRATROPIUM-ALBUTEROL 0.5-2.5 (3) MG/3ML IN SOLN: 3.0000 mL | Freq: Three times a day (TID) | RESPIRATORY_TRACT | Status: DC

## 2024-05-20 MED ORDER — DIPHENHYDRAMINE HCL 25 MG PO CAPS: 25.0000 mg | ORAL_CAPSULE | Freq: Every evening | ORAL | Status: DC | PRN

## 2024-05-20 MED ORDER — METHYLPREDNISOLONE SODIUM SUCC 125 MG IJ SOLR
80.0000 mg | Freq: Once | INTRAMUSCULAR | Status: AC
  Administered 2024-05-20: 80 mg via INTRAVENOUS
  Filled 2024-05-20 (×2): qty 2

## 2024-05-20 MED ORDER — ONDANSETRON HCL 4 MG PO TABS
4.0000 mg | ORAL_TABLET | Freq: Four times a day (QID) | ORAL | Status: DC | PRN
  Filled 2024-05-20: qty 1

## 2024-05-20 MED ORDER — SODIUM CHLORIDE 0.9 % IV SOLN
1.0000 g | INTRAVENOUS | Status: AC
  Administered 2024-05-21 – 2024-05-23 (×3): 1 g via INTRAVENOUS
  Filled 2024-05-20 (×3): qty 10

## 2024-05-20 MED ORDER — DIPHENHYDRAMINE HCL 25 MG PO CAPS
25.0000 mg | ORAL_CAPSULE | Freq: Every evening | ORAL | Status: DC | PRN
  Administered 2024-05-20 – 2024-05-23 (×4): 25 mg via ORAL
  Filled 2024-05-20 (×5): qty 1

## 2024-05-20 MED ORDER — METOPROLOL TARTRATE 12.5 MG HALF TABLET
12.5000 mg | ORAL_TABLET | Freq: Two times a day (BID) | ORAL | Status: DC | PRN
  Administered 2024-05-20: 12.5 mg via ORAL
  Filled 2024-05-20 (×5): qty 1

## 2024-05-20 NOTE — Assessment & Plan Note (Signed)
 Blood sugar in 180s Suspect element of steroid induced hyperglycemia  Will trend blood sugar for now  A1C x 1 Anticipate decreased w/ downtrending steroid use.  Consider low dose lantus vs. Glipizide in [facility] setting as appropriate

## 2024-05-20 NOTE — Consult Note (Signed)
 Referring Provider: [facility] Primary Care Physician:  Patient, No Pcp Per Primary Gastroenterologist: Unassigned  Reason for Consultation: Chronic diarrhea, weight loss  HPI: Sharon Wilkinson is a 83 y.o. female with past medical history of right kidney cancer status post right nephrectomy, COPD, arthritis presented to the hospital with dyspnea, cough and fatigue.  Was found to have community-acquired pneumonia with COPD exacerbation and acute respiratory failure with hypoxia.  Patient with history of chronic diarrhea but was complaining of worsening symptoms for last 5 days along with around 35 pounds weight loss in last 1 year . GI is consulted for further evaluation. Initial blood work yesterday showed stable CBC with normal hemoglobin, mildly elevated white counts, normal CMP.  Patient seen and examined at bedside.  Multiple family members at bedside.  She is complaining of gradual weight loss over the last 1 year.  She is complaining of intermittent loose stools with 1-2 bowel movement in the morning for last 4 months.  Denies any watery diarrhea.  Denies any blood in the stool or black stool.  Denies any nausea or vomiting.  Complaining of decreased appetite.  No previous EGD or colonoscopy.  No family history of colon cancer    Past Medical History:  Diagnosis Date   Arthritis    all over   Cancer [facility])    rt kidney cancer   COPD (chronic obstructive pulmonary disease) (HCC)    smoker   Smoker    3/4-1 ppd   Soft tissue mass    left shoulder    Past Surgical History:  Procedure Laterality Date   BACK SURGERY     KNEE ARTHROSCOPY Right    MASS EXCISION Left 01/23/2019   Procedure: EXCISION OF SOFT TISSUE MASS LEFT SHOULDER;  Surgeon: Eletha Boas, MD;  Location: [facility];  Service: General;  Laterality: Left;   NEPHRECTOMY Right    for cancer   ORIF HIP FRACTURE Right    SPINE SURGERY     TRIGGER FINGER RELEASE Right     Prior to Admission medications   Medication Sig Start Date End Date Taking? Authorizing Provider  acetaminophen  (TYLENOL ) 325 MG tablet Take 325 mg by mouth in the morning.   Yes [provider]  DELSYM 30 MG/5ML liquid Take 30-60 mg by mouth every 12 (twelve) hours as needed for cough.   Yes [provider]  Nicotine  (NICODERM CQ  TD) Place 1 patch onto the skin daily as needed (for smoking cessation).   Yes [provider]  TYLENOL  PM EXTRA STRENGTH 500-25 MG TABS tablet Take 1 tablet by mouth at bedtime.   Yes [provider]  traMADol  (ULTRAM ) 50 MG tablet Take 1-2 tablets (50-100 mg total) by mouth every 6 (six) hours as needed for moderate pain or severe pain. Patient not taking: Reported on 05/19/2024 01/23/19   Eletha Boas, MD    Scheduled Meds:  amLODipine   2.5 mg Oral Daily   aspirin  EC  81 mg Oral Once per day on Monday Wednesday Friday   enoxaparin  (LOVENOX ) injection  30 mg Subcutaneous Q24H   feeding supplement  237 mL Oral TID BM   ipratropium-albuterol   3 mL Nebulization Q8H   methylPREDNISolone  (SOLU-MEDROL ) injection  80 mg Intravenous Once   [START ON 05/21/2024] predniSONE   50 mg Oral Q breakfast   Continuous Infusions:  azithromycin      cefTRIAXone  (ROCEPHIN )  IV     PRN Meds:.hydrALAZINE , ipratropium-albuterol , nicotine   Allergies as of 05/19/2024   (No Known  Allergies)    Family History  Problem Relation Age of Onset   Heart disease Mother    Heart disease Father     Social History   Socioeconomic History   Marital status: Married    Spouse name: Not on file   Number of children: Not on file   Years of education: Not on file   Highest education level: Not on file  Occupational History   Not on file  Tobacco Use   Smoking status: Every Day    Current packs/day: 0.75    Average packs/day: 0.8 packs/day for 41.3 years (31.0 ttl pk-yrs)    Types: Cigarettes    Start date: 01/23/1983   Smokeless tobacco: Never  Substance and Sexual Activity    Alcohol use: Never   Drug use: Never   Sexual activity: Not on file  Other Topics Concern   Not on file  Social History Narrative   Not on file   Social Drivers of Health   Tobacco Use: High Risk (05/19/2024)   Patient History    Smoking Tobacco Use: Every Day    Smokeless Tobacco Use: Never    Passive Exposure: Not on file  Financial Resource Strain: Not on file  Food Insecurity: No Food Insecurity (05/19/2024)   Epic    Worried About Programme Researcher, Broadcasting/film/video in the Last Year: Never true    Ran Out of Food in the Last Year: Never true  Transportation Needs: No Transportation Needs (05/19/2024)   Epic    Lack of Transportation (Medical): No    Lack of Transportation (Non-Medical): No  Physical Activity: Not on file  Stress: Not on file  Social Connections: Moderately Isolated (05/19/2024)   Social Connection and Isolation Panel    Frequency of Communication with Friends and Family: More than three times a week    Frequency of Social Gatherings with Friends and Family: More than three times a week    Attends Religious Services: Never    Database Administrator or Organizations: No    Attends Banker Meetings: Never    Marital Status: Married  Catering Manager Violence: Not At Risk (05/19/2024)   Epic    Fear of Current or Ex-Partner: No    Emotionally Abused: No    Physically Abused: No    Sexually Abused: No  Depression (PHQ2-9): Not on file  Alcohol Screen: Not on file  Housing: Low Risk (05/19/2024)   Epic    Unable to Pay for Housing in the Last Year: No    Number of Times Moved in the Last Year: 0    Homeless in the Last Year: No  Utilities: Not At Risk (05/19/2024)   Epic    Threatened with loss of utilities: No  Health Literacy: Not on file    Review of Systems: All negative except as stated above in HPI.  Physical Exam: Vital signs: Vitals:   05/20/24 0644 05/20/24 0726  BP: (!) 174/72 (!) 142/71  Pulse: 94   Resp:    Temp:    SpO2:        General:   Elderly patient, not in acute distress, oxygen by nasal cannula Lungs: Decreased breath sounds bilaterally Heart:  Regular rate and rhythm; no murmurs, clicks, rubs,  or gallops. Abdomen: Soft, nontender, nondistended, bowel sound present, no peritoneal signs Mood and affect normal Alert and oriented x 3 Rectal:  Deferred  GI:  Lab Results: Recent Labs    05/19/24 0955 05/20/24 9661  WBC 13.7* 16.3*  HGB 17.3* 15.6*  HCT 53.7* 47.7*  PLT 271 282   BMET Recent Labs    05/19/24 0955  NA 139  K 3.7  CL 98  CO2 28  GLUCOSE 125*  BUN 18  CREATININE 0.63  CALCIUM 9.7   LFT Recent Labs    05/19/24 0955  PROT 8.1  ALBUMIN 3.8  AST 30  ALT 17  ALKPHOS 121  BILITOT 0.6   PT/INR No results for input(s): LABPROT, INR in the last 72 hours.   Studies/Results: ECHOCARDIOGRAM COMPLETE Result Date: 05/19/2024    ECHOCARDIOGRAM REPORT   Patient Name:   JAQUILLA WOODROOF Date of Exam: 05/19/2024 Medical Rec #:  [id number]    Height:       65.0 in Accession #:    [id number]   Weight:       100.0 lb Date of Birth:  03/04/1941    BSA:          1.473 m Patient Age:    83 years     BP:           112/71 mmHg Patient Gender: F            HR:           96 bpm. Exam Location:  Inpatient Procedure: 2D Echo, Cardiac Doppler and Color Doppler (Both Spectral and Color            Flow Doppler were utilized during procedure). Indications:    Abnormal ECG  History:        Patient has no prior history of Echocardiogram examinations.  Sonographer:    Philomena Daring Referring Phys: [id number] DAVID MANUEL ORTIZ  Sonographer Comments: Technically difficult study due to poor echo windows. Image acquisition challenging due to patient body habitus. Patient unable to sit through complete exam due to pain. IMPRESSIONS  1. Left ventricular ejection fraction, by estimation, is 60 to 65%. The left ventricle has normal function. Left ventricular endocardial border not optimally defined to evaluate regional  wall motion. There is mild left ventricular hypertrophy. Left ventricular diastolic parameters are consistent with Grade I diastolic dysfunction (impaired relaxation).  2. Right ventricular systolic function is normal. The right ventricular size is normal. Tricuspid regurgitation signal is inadequate for assessing PA pressure.  3. The mitral valve is grossly normal. Trivial mitral valve regurgitation. No evidence of mitral stenosis.  4. The aortic valve is grossly normal. Aortic valve regurgitation is not visualized. No aortic stenosis is present.  5. The inferior vena cava is normal in size with greater than 50% respiratory variability, suggesting right atrial pressure of 3 mmHg. FINDINGS  Left Ventricle: Left ventricular ejection fraction, by estimation, is 60 to 65%. The left ventricle has normal function. Left ventricular endocardial border not optimally defined to evaluate regional wall motion. The left ventricular internal cavity size was normal in size. There is mild left ventricular hypertrophy. Left ventricular diastolic parameters are consistent with Grade I diastolic dysfunction (impaired relaxation). Right Ventricle: The right ventricular size is normal. No increase in right ventricular wall thickness. Right ventricular systolic function is normal. Tricuspid regurgitation signal is inadequate for assessing PA pressure. Left Atrium: Left atrial size was normal in size. Right Atrium: Right atrial size was normal in size. Pericardium: There is no evidence of pericardial effusion. Mitral Valve: The mitral valve is grossly normal. Trivial mitral valve regurgitation. No evidence of mitral valve stenosis. Tricuspid Valve: The tricuspid valve is grossly normal. Tricuspid valve regurgitation is  trivial. No evidence of tricuspid stenosis. Aortic Valve: The aortic valve is grossly normal. Aortic valve regurgitation is not visualized. No aortic stenosis is present. Pulmonic Valve: The pulmonic valve was not well  visualized. Pulmonic valve regurgitation is not visualized. No evidence of pulmonic stenosis. Aorta: The aortic root is normal in size and structure. Venous: The inferior vena cava is normal in size with greater than 50% respiratory variability, suggesting right atrial pressure of 3 mmHg. IAS/Shunts: The interatrial septum was not well visualized.  LEFT VENTRICLE PLAX 2D LVIDd:         3.60 cm   Diastology LVIDs:         2.50 cm   LV e' medial:    4.46 cm/s LV PW:         1.20 cm   LV E/e' medial:  10.8 LV IVS:        1.10 cm   LV e' lateral:   6.64 cm/s LVOT diam:     2.00 cm   LV E/e' lateral: 7.2 LVOT Area:     3.14 cm  IVC IVC diam: 1.60 cm LEFT ATRIUM         Index LA diam:    2.10 cm 1.43 cm/m   AORTA Ao Root diam: 2.80 cm MITRAL VALVE MV Area (PHT): 5.97 cm    SHUNTS MV Decel Time: 127 msec    Systemic Diam: 2.00 cm MV E velocity: 48.00 cm/s MV A velocity: 69.00 cm/s MV E/A ratio:  0.70 Soyla Merck MD Electronically signed by Soyla Merck MD Signature Date/Time: 05/19/2024/3:11:38 PM    Final    DG Chest Port 1 View Result Date: 05/19/2024 EXAM: 1 VIEW(S) XRAY OF THE CHEST 05/19/2024 COMPARISON: Chest radiographs 01/22/2013 and earlier. CLINICAL HISTORY: 83 year old female with cough. FINDINGS: LUNGS AND PLEURA: Large lung volumes, chronic hyperinflation. Streaky and confluent right lower lung peribronchial opacity, consistent with bronchopneumonia. No pleural effusion. No pneumothorax. HEART AND MEDIASTINUM: Atherosclerotic aortic arch calcifications. No acute abnormality of the cardiac silhouette. BONES AND SOFT TISSUES: No acute osseous abnormality. IMPRESSION: 1. Right basilar opacity suspicious for bronchopneumonia. 2. Chronic hyperinflation. Electronically signed by: Helayne Hurst MD 05/19/2024 11:18 AM EST RP Workstation: [id number]    Impression/Plan: -Gradual weight loss of 30 pounds over last 8 to 12 months.  Complaining of decreased appetite.  Denies any nausea or vomiting.  Denies  any blood in the stool or black stool. - Loose stools.  Usually 1-2 bowel movements in the morning. - COPD exacerbation - Community-acquired pneumonia  Recommendations ----------------------- - CT chest abdomen pelvis performed today.  Report pending.  CT was without contrast. - Patient declined stool test as of diarrhea is improving.  Only 1 bowel movement today which was loose in consistency. - Patient is being transition to hospital at home program. - If ongoing weight loss, she may need contrast-enhanced CT scan and may be down the road EGD and colonoscopy depending on her overall respiratory status/clinical status. - Okay to discharge from GI standpoint.  GI will sign off.  Follow-up in GI clinic in 2 to 3 months after discharge.    LOS: 1 day   Layla Lah  MD, FACP 05/20/2024, 10:06 AM  Contact #  [phone]

## 2024-05-20 NOTE — Progress Notes (Signed)
 This EMT and EMTP Johann went to room 1423 @[facility]  to transport pt home for H@H  program. Upon arrival to the room the pt was found sitting upright in bed. She was alert and oriented and ready to go home!!!! The pt was on 2LPM of 02 via [place] at this time. The pt was then assisted off the bed and into a WC. The pt was then placed on 2LPM via [place] on a portable concentrator for transport. The pt was then wheeled outside to the H@H  QRV. She was then assisted into the vehicle and restrained with a shoulder and lap belt. She was then transported home. No incidents were reported during transport. Upon arrival to the home the pt was assisted out of the vehicle. She was then assisted up the stairs and into the home where she then ambulated to her recliner chair in the living room. All H@H  equipment was then set up and explained to both the pt and the family present in the home. Both the family and the pt verbalized understandings on how to use and troubleshoot all H@H  equipment. Vital signs were then obtained on the pt and documented into the flowchart. Virtual RN was then contacted via tablet to confirm current health readings were strong and reading correctly. At this time the admission was finished by [facility] and EMTP [facility].

## 2024-05-20 NOTE — Assessment & Plan Note (Addendum)
 COPD Exacerbation  PNA  Decompensated resp status now requiring 2-3L [place] to keep O2 sats >92%  Noted R basilar PNA on CXR  + coughing, wheezing and increased sputum production in setting of extensive tobacco history and ? Recent diagnosis of obstructive lung disease  12/30 - day 5/5 antibiotics. Pred 50 Complete course of Rocephin  and Zithromax  today Start prednisone  taper tomorrow 50 >> 40 mg, plan to wean by 10 mg daily vs every other Initial high dose IV steroid  Steroid may be contributing to her BP's --Prn duonebs  --Cont supplemental O2 to keep sats > 90% --IS & Flutter valve  --Home O2 qualification done and home o2 has been ordered --Pulmonology outpatient referral has been placed for COPD/emphysema and lung nodule follow up  12/30 - O2 turned up this afternoon 2>>3 L/min due to spO02 88% on 2L.  Pt declined chest x-ray for now.  Medic exam with wheezing and bibasilar rhonchi not previously heard (?now audible with improved air movement vs. Edema vs persistent PNA) --Check a repeat proBNP for now --CXR tomorrow if pt agreeable  12/31: Tapering dose of prednisone  prescribed on discharge.

## 2024-05-20 NOTE — Progress Notes (Signed)
 Admitted patient to hospital at home program. Discussed Hospital at home policies regarding home medications, taking only hospital dispensed medications, how to call nurse,paramedic /EMT visit bid and camera visit with MD and RN daily and prn,the need to call RN for supervision on any medications taken outside window of paramedic visit,current health equipment.All questions answered.  Plan of care and education initiated.  Pt's son Dale at her side.

## 2024-05-20 NOTE — Assessment & Plan Note (Signed)
 Iniital BPs 160s-200s/90s-100s  Has had no consistent outpatient follow up  Initially improved w/ prn IV hydralazine  Bridged to norvasc  2.5-->5mg   Will monitor  Consider addition of ACEi vs. ARB as appropriate

## 2024-05-20 NOTE — Assessment & Plan Note (Signed)
 Noted prior history of elevated BNP  ProBNP 1800 12/26  2D ECHO 05/19/24 w/ EF 60-65% and grade 1 diastolic dysfunction  Appears euvolemic  Monitor volume status  Would need outpatient cardiology follow up.

## 2024-05-20 NOTE — Progress Notes (Addendum)
 Pt seen for admission visit to [facility] program. Pt transported via QRV without incident. Pt assisted to walk into her home via front steps with assistance. Pt uses a walker with no wheels occasionally at home. Pt is open to using a regular walker if we can have one ordered, to prevent falls while recovering.  All CH equipment set up and calibrated by Faith, EMT. Pt, Pt's sister and Pt's son are present and shown how to make and receive calls via tablet as well as by phone.   Medication bins set up in kitchen. Pt and family shown how to use nebulizer machine. O2 concentrator set up with extra tubing.  Home safety assessment completed and photos uploaded to teams logistics chat. No hazards noted. Walkways throughout home are clear, well lit, and free of clutter. Pt has 3 small friendly dogs in the home. Pt reports her son having firearms at the home which have all been secured.  Skin check completed with virtual RN, Jon.  Medications administered as noted, including duoneb and Norvasc  due to HTN, per Dr. Eldonna.   Vital signs and assessment completed. Pt's lung sounds reveal rhonchi and expiratory wheezes in Rt upper field, rhonchi and diminished in Lt upper field and diminished and rhonchi in both lower fields. Pt has productive cough. Heart tones are s1, s2 and pulses are strong and regular. Pt's bowel sounds are active and abdomen is soft and non-tender. Pt had a bm today that was slightly loose but not watery. Pt denies any pain.  Pt encouraged to call with any problems or questions. Pt informed another Paramedic will be out later to bring other new ordered medications.

## 2024-05-20 NOTE — Progress Notes (Signed)
 Spoke with pt's son Dale to update on timing of transport. Dale agreed to secure fire arms before patient and staff arrival.

## 2024-05-20 NOTE — Assessment & Plan Note (Signed)
 Hgb 15.6 today  Looks to be near baseline  Follow

## 2024-05-20 NOTE — Progress Notes (Signed)
 SATURATION QUALIFICATIONS: (This note is used to comply with regulatory documentation for home oxygen)  Patient Saturations on Room Air at Rest = 89%  Patient Saturations on Room Air while Ambulating = 82%  Patient Saturations on 2 Liters of oxygen while Ambulating = 90%  Please briefly explain why patient needs home oxygen:to maintain oxygenation > 88% while performing ADL's such as ambulation. Darice Potters PT Acute Rehabilitation Services Office [phone]

## 2024-05-20 NOTE — Assessment & Plan Note (Signed)
 Noted prior history of RCC s/p R nephrectomy  Cr 0.63 today  Will monitor  Try to avoid nephrotoxins in setting of solitary functioning kidney

## 2024-05-20 NOTE — Progress Notes (Addendum)
 " Hospital at Home Interim Note   Patient: Sharon Wilkinson FMW:[id number] DOB: 1941/02/14 DOA: 05/19/2024     1 DOS: the patient was seen and examined on 05/20/2024   Brief hospital course: MEHAK ROSKELLEY is a 83 y.o. female with medical history significant of osteoarthritis of multiple sites, right kidney cancer, right nephrectomy, left shoulder soft tissue mass, active smoker, COPD who was sent from the urgent care center to the emergency department due to nonproductive cough, dyspnea and fatigue for the past 5 days.  However, she was having severe hypertension and was referred to the emergency department.  She has also been having chronic diarrhea, but he has gotten worse in the last 5 days.  She denied fever, chills, rhinorrhea, sore throat, wheezing or hemoptysis.  No chest pain, palpitations, diaphoresis, PND, orthopnea or pitting edema of the lower extremities.  No abdominal pain, nausea, emesis, constipation, melena or hematochezia.  No flank pain, dysuria, frequency or hematuria.  No polyuria, polydipsia, polyphagia or blurred vision.    Lab work: CBC showed a white count of 13.7, hemoglobin 17.3 g/dL and platelets 728.  proBNP was 1830.0 pg/mL.  CMP was unremarkable with the assassin of a nonfasting glucose of 125 mg/dL.   Imaging: Portable 1 view chest radiograph showing normal heart size, right basilar opacity suspicious for bronchopneumonia.  Chronic hyperinflation.   ED course: Initial vital signs were temperature 98.7 F, pulse 72, respiration 18, BP 190/112 mmHg and O2 sat 91% on room air.  The patient subsequently had hypoxia going down to 84% on room air.  She was placed on nasal cannula oxygen and her most recent O2 sat was 93% on [place] at 2 LPM.  She received 125 mg of Solu-Medrol  and a DuoNeb.  After discussion with patient and family, they are in agreement with the hospital at home program.   Assessment and Plan: * Acute respiratory failure with hypoxia (HCC) COPD Exacerbation  PNA    Decompensated resp status now requiring 2-3L [place] to keep O2 sats >92%  Noted R basilar PNA on CXR  + coughing, wheezing and increased sputum production in setting of extensive tobacco history and ? Recent diagnosis of obstructive lung disease  IV rocephin  and azithromycin  for resp coverage  Initial high dose IV steroid Prn duonebs  Cont supplemental O2  IS  Flutter valve  Will need outpatient pulmonary follow up   Chronic diarrhea Weight loss  Noted roughly 35 lb weight loss over the last several months per patient (nondescript)  Reports chronic intermittent diarrhea without frank bloody or bilious morphology  Now resolved per patient  No decreased appetite GI consulted- appreciate input  CT A&P pending to better assess- though contrasted study would be of greater benefit (deferrred in setting of hx/o nephrectomy) Prealbumin level pending  Pt declined stool studies per report  Dietary consult placed  Added on ensure supplementation  Will otherwise monitor-follow up recommendations    History of nephrectomy, right Noted prior history of RCC s/p R nephrectomy  Cr 0.63 today  Will monitor  Try to avoid nephrotoxins in setting of solitary functioning kidney    Chronic heart failure with preserved ejection fraction (HFpEF) (HCC) Noted prior history of elevated BNP  ProBNP 1800 12/26  2D ECHO 05/19/24 w/ EF 60-65% and grade 1 diastolic dysfunction  Appears euvolemic  Monitor volume status  Would need outpatient cardiology follow up.     Polycythemia secondary to smoking Hgb 15.6 today  Looks to be near baseline  Follow  Steroid-induced hyperglycemia Blood sugar in 180s Suspect element of steroid induced hyperglycemia  Will trend blood sugar for now  A1C x 1 Anticipate decreased w/ downtrending steroid use.  Consider low dose lantus vs. 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 ARB as appropriate        Subjective: Shortness of breath overall improved today, though still with increased WOB. Still with mild cough and wheezing.   Physical Exam: Vitals:   05/20/24 0644 05/20/24 0726 05/20/24 1036 05/20/24 1501  BP: (!) 174/72 (!) 142/71 (!) 155/85 (!) 155/90  Pulse: 94  95 (!) 104  Resp:   18 16  Temp:   98 F (36.7 C) 98.2 F (36.8 C)  TempSrc:   Oral Oral  SpO2:   95% 90%  Weight:      Height:       Physical Exam Constitutional:      Comments: Underweight    HENT:     Head: Normocephalic and atraumatic.     Nose: Nose normal.     Mouth/Throat:     Mouth: Mucous membranes are moist.  Cardiovascular:     Rate and Rhythm: Normal rate and regular rhythm.  Pulmonary:     Effort: Pulmonary effort is normal.     Breath sounds: Wheezing present.  Abdominal:     General: Bowel sounds are normal.  Musculoskeletal:        General: Normal range of motion.  Skin:    General: Skin is warm and dry.  Neurological:     General: No focal deficit present.  Psychiatric:        Mood and Affect: Mood normal.     Data Reviewed:  There are no new results to review at this time.  Family Communication: Plan of care to transition to the hospital at home program discussed with patient, son and sister over the course of the day. All parties in agreement to transition to hospital at home  Disposition: Status is: Inpatient Remains inpatient appropriate because: Continued hypoxia with increased WOB, cough and wheezing. Still  requiring IV steroids and IV antibiotics.    Planned Discharge Destination: Home    Hospital at Home Admission Criteria Checklist:  Formal consent explained in detail and signed at the bedside: yes Patient meets inpatient admission criteria (see below for further details) yes Is pt Medicare FFS/[employer] Medicare-Medicaid, [employer], [employer] Medicare, [employer], [employer] ( required for initial launch with plan to expand)? yes Lives within 25 mil/ 30 min from [facility] within [place](pt may stay with family member during admission who lives within 25 miles or 30 min from [facility] w/in [facility])? yes Hemodynamically stable with relatively low risk of clinical deterioration-not requiring ICU? yes Age >55? yes Does not require frequent touch-points or complex interventions/medications (ie Titrated Infusions (IV insulin, heparin drips, vasoactive drips, use of infused or injectable controlled substances, patients on insulin)? no Any Behavioral Health comorbidities likely to increase risk for in-home care (ie Acute delirium or  experiencing a marked altered mental status and cause is not a treatable condition in the home)? no Has the patient been on BIPAP during course of ED evaluation or hospitalization? no IF YES, Has the patient been off of BIPAP for >24 hours(If NO-THEN PATIENT DOES MEET INCLUSION CRITERIA)? not applicable On Room Air or Needs oxygen at home (<6L)? is on oxygen at 3-4 L/min per nasal cannula. Active safety concerns (ie Unable to use bedside commode independently and lacks caregiver support for safety- needs SNF placement, unable to obtain IV access)? no Has skin check been performed? no pending w/ paramedic team  Has Physical Therapy screened the patient? yes  Common admission diagnoses including: CAP, COPD Exacerbation, Acute on chronic heart failure, Cellulitis, UTI , dehydration, acute resp failure with hypoxia (requiring <5L)   Social Screening:  - Has the  family been directly contacted about Hospital at Home program with consent obtained (if yes- please document who was spoken to with name and phone number)? yes  -Was the family approached about the use of TOC pharmacy for medications at discharge? no Denies significant ETOH intake? yes Does not smoke and understands may not smoke in the presence of oxygen? yes Patient states able to use iPad/phone for communication/has family who is able to use? yes Patient has agreed to be compliant with medication and treatment regimen of the program? yes Any active drug use in patient or primary caregiver including daily dosing of methadone? no Stable home environment ( access to appropriate heating in cold conditions and/or appropriate air conditioning in hot conditions and/or no running water/electricity)? yes  No aggressive pets at home? no Firearm present? yes  With ability or willingness to store them unloaded in a locked case for duration of hospitalization? yes Ambulatory? yes  mild difficulty Bed bugs present on home evaluation? no Family support system in place? yes Patient feels safe at home and does not endorse any violence? yes Any actively decompensated behavioral health issues including agitation/aggressive behavior? no  Patient requests food to be provided by hospital home program? no PT/OT eval completed and not requiring SNF, ALF, inpatient rehab? yes  To be admitted to the [facility] program, a patient generally must meet the following: 1. Requirement for Inpatient Level of Care: The patient's condition must necessitate an inpatient level of care. This is typically indicated by one or more of the following, depending on their specific diagnosis:  Persistent tachycardia despite appropriate treatment (e.g., for Heart Failure, UTI). Persistent tachypnea (rapid breathing) or dyspnea (shortness of breath) that hasn't improved sufficiently with observation care (e.g., for Heart Failure,  Pneumonia, Viral Illness, COVID). Hypoxemia (low oxygen levels), such as a new need for oxygen, an increased need from baseline, or specific oxygen saturation levels (e.g., SpO2 <90-94% depending on the condition) that persist despite observation (e.g., for Heart Failure, COPD, Pneumonia, Viral Illness, COVID). Need for Intravenous (IV) hydration due to an inability to maintain oral hydration, which persists despite observation care (e.g., for Cellulitis, UTI, Viral Illness, COVID). Specific to Heart Failure: Persistent pulmonary edema, indicated by a new oxygen need, lack of improvement with IV diuretics, and ongoing tachypnea/dyspnea. Specific to COPD: A decrease in known baseline resting oxygen saturation (SpO2) by 4% or more, or an increase in pre-existing supplemental oxygen requirements, which persists despite observation and requires continued close monitoring. Specific to Pneumonia: A Pneumonia Severity Index (PSI) class IV (moderate risk). Specific to Cellulitis: Failure of outpatient antibiotic therapy (indicated by progression or no improvement after a  minimum of 48 hours on an adequate regimen) or a clinical presentation (like acuity or rapidity of progression) that requires the intensity of monitoring found in an inpatient setting. Specific to UTI: Persistence or worsening of clinical findings like fever, pain, or dehydration despite observation care; presence of significant uropathy; suspected infection of an indwelling prosthetic device, stent, implant, or graft; or pregnancy with suspected pyelonephritis.  2. Appropriateness for Hospital at Home Setting: The patient's overall clinical picture, including the severity of their illness, their care needs, and their medical history and comorbidities, must be suitable for management in the Hospital at Home environment. This essentially means that none of the exclusion criteria (listed below) are met.  Unified Exclusion Criteria for Hospital  at Home Admission: A patient would not be eligible for Hospital at Home if any of the following are present: Hemodynamic Instability: Hypotension (low blood pressure) is present. Respiratory Instability or Needs Beyond Program Capability: There is a new need for invasive or noninvasive ventilatory assistance (like BiPAP or a ventilator). Oxygenation is not sufficient, generally indicated if an FiO2 (fraction of inspired oxygen) of 45% (which is about 6 Liters/minute via nasal cannula) or more is required to keep oxygen saturation (SpO2) at 90% or greater. Monitoring or Procedural Needs Beyond Program Capability: There is a need for invasive monitoring, such as a pulmonary artery catheter or an arterial line. There is a need for immediate-response telemetry monitoring (for dangerous arrhythmia detection and subsequent immediate intervention). The required medication regimen is beyond the capabilities of Hospital at Home (e.g., dosing intervals are too frequent for home administration). There is a need for a procedure that cannot be performed by the [facility] at [facility] team (e.g., significant wound debridement or abscess drainage for cellulitis, or percutaneous nephrostomy for a complicated UTI). Significant Organ Dysfunction or Markers of Severe Illness: Mental status is not at baseline, or there is altered mental status suggestive of inadequate perfusion. Renal (kidney) function is unstable or showing an ongoing decline. There is evidence of inadequate perfusion, such as metabolic acidosis or myocardial ischemia. Uncompensated acidosis is present. Condition-Specific Severity or Complications Making Home Care Unsuitable: For Heart Failure: Known severe cardiac valvular disease (e.g., aortic stenosis, mitral regurgitation); or severe peripheral edema that impairs the ability to urinate or ambulate. For COPD: Known concurrent comorbidity or finding that indicates a higher-risk COPD exacerbation (e.g.,  pulmonary fibrosis, cavitation, pleural effusion, pneumothorax, rib fracture). For Pneumonia: Pneumonia Severity Index (PSI) class V (indicating high risk for inpatient mortality); known concurrent comorbidity or finding that indicates higher-risk pneumonia (e.g., pulmonary fibrosis, cavitation, large or loculated pleural effusion); or a concomitant serious infectious process like endocarditis or empyema. For Cellulitis: Orbital, periorbital, or necrotizing infection is suspected; or a concomitant serious infectious process like endocarditis, septic emboli, or septic joint space infection. For UTI: Urinary tract obstruction (e.g., kidney stone, bladder outlet obstruction); or a concomitant serious infectious process like endocarditis or septic emboli. For Viral Illness & COVID-19: A concomitant serious infectious process like endocarditis or empyema.  General Comorbidities or Status:  The patient is significantly immunosuppressed (this applies to Pneumonia, Cellulitis, UTI, Viral Illness, and COVID-19). The patient meets inpatient admission criteria for a second diagnosis, or has care needs beyond the capabilities of Hospital at Home due to an active clinically significant comorbidity. (This is a general exclusion across all listed conditions)  Time spent: >60  minutes  Author: Elspeth JINNY Masters, MD 05/20/2024 3:27 PM  For on call review [email or web address].  "

## 2024-05-20 NOTE — Progress Notes (Signed)
 Virtual visit with Sharon Wilkinson and her son. [name], paramedic and [name], EMT were there as well delivery medications. Manual BP checked and elevated,PRN metoprolol  given. Urine sample collected. Unable to collect sputum at this time. Cup left and educated on collection process.She has no cough, no shob. She is on 2L nasal cannula. She ambulates well and does not get winded. She performed incentive spirometer and flutter valve without difficulty.

## 2024-05-20 NOTE — Assessment & Plan Note (Signed)
 Weight loss  Noted roughly 35 lb weight loss over the last several months per patient (nondescript)  Reports chronic intermittent diarrhea without frank bloody or bilious morphology  Now resolved per patient  No decreased appetite GI consulted- appreciate input  CT A&P pending to better assess- though contrasted study would be of greater benefit (deferrred in setting of hx/o nephrectomy) Prealbumin level pending  Pt declined stool studies per report  Dietary consult placed  Added on ensure supplementation  Will otherwise monitor-follow up recommendations

## 2024-05-20 NOTE — Progress Notes (Incomplete Revision)
 " Hospital at Home Interim Note   Patient: Sharon Wilkinson FMW:[id number] DOB: Dec 20, 1940 DOA: 05/19/2024     1 DOS: the patient was seen and examined on 05/20/2024   Brief hospital course: Sharon Wilkinson is a 83 y.o. female with medical history significant of osteoarthritis of multiple sites, right kidney cancer, right nephrectomy, left shoulder soft tissue mass, active smoker, COPD who was sent from the urgent care center to the emergency department due to nonproductive cough, dyspnea and fatigue for the past 5 days.  However, she was having severe hypertension and was referred to the emergency department.  She has also been having chronic diarrhea, but he has gotten worse in the last 5 days.  She denied fever, chills, rhinorrhea, sore throat, wheezing or hemoptysis.  No chest pain, palpitations, diaphoresis, PND, orthopnea or pitting edema of the lower extremities.  No abdominal pain, nausea, emesis, constipation, melena or hematochezia.  No flank pain, dysuria, frequency or hematuria.  No polyuria, polydipsia, polyphagia or blurred vision.    Lab work: CBC showed a white count of 13.7, hemoglobin 17.3 g/dL and platelets 728.  proBNP was 1830.0 pg/mL.  CMP was unremarkable with the assassin of a nonfasting glucose of 125 mg/dL.   Imaging: Portable 1 view chest radiograph showing normal heart size, right basilar opacity suspicious for bronchopneumonia.  Chronic hyperinflation.   ED course: Initial vital signs were temperature 98.7 F, pulse 72, respiration 18, BP 190/112 mmHg and O2 sat 91% on room air.  The patient subsequently had hypoxia going down to 84% on room air.  She was placed on nasal cannula oxygen and her most recent O2 sat was 93% on [place] at 2 LPM.  She received 125 mg of Solu-Medrol  and a DuoNeb.  After discussion with patient and family, they are in agreement with the hospital at home program.   Assessment and Plan: * Acute respiratory failure with hypoxia (HCC) COPD Exacerbation  PNA    Decompensated resp status now requiring 2-3L  to keep O2 sats >92%  Noted R basilar PNA on CXR  + coughing, wheezing and increased sputum production in setting of extensive tobacco history and ? Recent diagnosis of obstructive lung disease  IV rocephin  and azithromycin  for resp coverage  Initial high dose IV steroid Prn duonebs  Cont supplemental O2  IS  Flutter valve  Will need outpatient pulmonary follow up   Chronic diarrhea Weight loss  Noted roughly 35 lb weight loss over the last several months per patient (nondescript)  Reports chronic intermittent diarrhea without frank bloody or bilious morphology  Now resolved per patient  No decreased appetite GI consulted- appreciate input  CT A&P pending to better assess- though contrasted study would be of greater benefit (deferrred in setting of hx/o nephrectomy) Prealbumin level pending  Pt declined stool studies per report  Dietary consult placed  Added on ensure supplementation  Will otherwise monitor-follow up recommendations    History of nephrectomy, right Noted prior history of RCC s/p R nephrectomy  Cr 0.63 today  Will monitor  Try to avoid nephrotoxins in setting of solitary functioning kidney    Chronic heart failure with preserved ejection fraction (HFpEF) (HCC) Noted prior history of elevated BNP  ProBNP 1800 12/26  2D ECHO 05/19/24 w/ EF 60-65% and grade 1 diastolic dysfunction  Appears euvolemic  Monitor volume status  Would need outpatient cardiology follow up.     Polycythemia secondary to smoking Hgb 15.6 today  Looks to be near baseline  Follow  Steroid-induced hyperglycemia Blood sugar in 180s Suspect element of steroid induced hyperglycemia  Will trend blood sugar for now  A1C x 1 Anticipate decreased w/ downtrending steroid use.  Consider low dose lantus vs. 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 ARB as appropriate        Subjective: Shortness of breath overall improved today, though still with increased WOB. Still with mild cough and wheezing.   Physical Exam: Vitals:   05/20/24 0644 05/20/24 0726 05/20/24 1036 05/20/24 1501  BP: (!) 174/72 (!) 142/71 (!) 155/85 (!) 155/90  Pulse: 94  95 (!) 104  Resp:   18 16  Temp:   98 F (36.7 C) 98.2 F (36.8 C)  TempSrc:   Oral Oral  SpO2:   95% 90%  Weight:      Height:       Physical Exam Constitutional:      Comments: Underweight    HENT:     Head: Normocephalic and atraumatic.     Nose: Nose normal.     Mouth/Throat:     Mouth: Mucous membranes are moist.  Cardiovascular:     Rate and Rhythm: Normal rate and regular rhythm.  Pulmonary:     Effort: Pulmonary effort is normal.     Breath sounds: Wheezing present.  Abdominal:     General: Bowel sounds are normal.  Musculoskeletal:        General: Normal range of motion.  Skin:    General: Skin is warm and dry.  Neurological:     General: No focal deficit present.  Psychiatric:        Mood and Affect: Mood normal.     Data Reviewed:  There are no new results to review at this time.  Family Communication: Plan of care to transition to the hospital at home program discussed with patient, son and sister over the course of the day. All parties in agreement to transition to hospital at home  Disposition: Status is: Inpatient Remains inpatient appropriate because: Continued hypoxia with increased WOB, cough and wheezing. Still  requiring IV steroids and IV antibiotics.    Planned Discharge Destination: Home    Hospital at Home Admission Criteria Checklist:  Formal consent explained in detail and signed at the bedside: yes Patient meets inpatient admission criteria (see below for further details) yes Is pt Medicare FFS/[employer] Medicare-Medicaid, [employer], [employer] Medicare, [employer]age, [employer] ( required for initial launch with plan to expand)? yes Lives within 25 mil/ 30 min from [facility] within [place](pt may stay with family member during admission who lives within 25 miles or 30 min from [facility] w/in [facility])? yes Hemodynamically stable with relatively low risk of clinical deterioration-not requiring ICU? yes Age >55? yes Does not require frequent touch-points or complex interventions/medications (ie Titrated Infusions (IV insulin, heparin drips, vasoactive drips, use of infused or injectable controlled substances, patients on insulin)? no Any Behavioral Health comorbidities likely to increase risk for in-home care (ie Acute delirium or  experiencing a marked altered mental status and cause is not a treatable condition in the home)? no Has the patient been on BIPAP during course of ED evaluation or hospitalization? no IF YES, Has the patient been off of BIPAP for >24 hours(If NO-THEN PATIENT DOES MEET INCLUSION CRITERIA)? not applicable On Room Air or Needs oxygen at home (<6L)? is on oxygen at 3-4 L/min per nasal cannula. Active safety concerns (ie Unable to use bedside commode independently and lacks caregiver support for safety- needs SNF placement, unable to obtain IV access)? no Has skin check been performed? no pending w/ paramedic team  Has Physical Therapy screened the patient? yes  Common admission diagnoses including: CAP, COPD Exacerbation, Acute on chronic heart failure, Cellulitis, UTI , dehydration, acute resp failure with hypoxia (requiring <5L)   Social Screening:  - Has the  family been directly contacted about Hospital at Home program with consent obtained (if yes- please document who was spoken to with name and phone number)? yes  -Was the family approached about the use of TOC pharmacy for medications at discharge? no Denies significant ETOH intake? yes Does not smoke and understands may not smoke in the presence of oxygen? yes Patient states able to use iPad/phone for communication/has family who is able to use? yes Patient has agreed to be compliant with medication and treatment regimen of the program? yes Any active drug use in patient or primary caregiver including daily dosing of methadone? no Stable home environment ( access to appropriate heating in cold conditions and/or appropriate air conditioning in hot conditions and/or no running water/electricity)? yes  No aggressive pets at home? no Firearm present? yes  With ability or willingness to store them unloaded in a locked case for duration of hospitalization? yes Ambulatory? yes  mild difficulty Bed bugs present on home evaluation? no Family support system in place? yes Patient feels safe at home and does not endorse any violence? yes Any actively decompensated behavioral health issues including agitation/aggressive behavior? no  Patient requests food to be provided by hospital home program? no PT/OT eval completed and not requiring SNF, ALF, inpatient rehab? yes  To be admitted to the Hospital at [facility] program, a patient generally must meet the following: 1. Requirement for Inpatient Level of Care: The patient's condition must necessitate an inpatient level of care. This is typically indicated by one or more of the following, depending on their specific diagnosis:  Persistent tachycardia despite appropriate treatment (e.g., for Heart Failure, UTI). Persistent tachypnea (rapid breathing) or dyspnea (shortness of breath) that hasn't improved sufficiently with observation care (e.g., for Heart Failure,  Pneumonia, Viral Illness, COVID). Hypoxemia (low oxygen levels), such as a new need for oxygen, an increased need from baseline, or specific oxygen saturation levels (e.g., SpO2 <90-94% depending on the condition) that persist despite observation (e.g., for Heart Failure, COPD, Pneumonia, Viral Illness, COVID). Need for Intravenous (IV) hydration due to an inability to maintain oral hydration, which persists despite observation care (e.g., for Cellulitis, UTI, Viral Illness, COVID). Specific to Heart Failure: Persistent pulmonary edema, indicated by a new oxygen need, lack of improvement with IV diuretics, and ongoing tachypnea/dyspnea. Specific to COPD: A decrease in known baseline resting oxygen saturation (SpO2) by 4% or more, or an increase in pre-existing supplemental oxygen requirements, which persists despite observation and requires continued close monitoring. Specific to Pneumonia: A Pneumonia Severity Index (PSI) class IV (moderate risk). Specific to Cellulitis: Failure of outpatient antibiotic therapy (indicated by progression or no improvement after a  minimum of 48 hours on an adequate regimen) or a clinical presentation (like acuity or rapidity of progression) that requires the intensity of monitoring found in an inpatient setting. Specific to UTI: Persistence or worsening of clinical findings like fever, pain, or dehydration despite observation care; presence of significant uropathy; suspected infection of an indwelling prosthetic device, stent, implant, or graft; or pregnancy with suspected pyelonephritis.  2. Appropriateness for Hospital at Home Setting: The patient's overall clinical picture, including the severity of their illness, their care needs, and their medical history and comorbidities, must be suitable for management in the Hospital at Home environment. This essentially means that none of the exclusion criteria (listed below) are met.  Unified Exclusion Criteria for Hospital  at Home Admission: A patient would not be eligible for Hospital at Home if any of the following are present: Hemodynamic Instability: Hypotension (low blood pressure) is present. Respiratory Instability or Needs Beyond Program Capability: There is a new need for invasive or noninvasive ventilatory assistance (like BiPAP or a ventilator). Oxygenation is not sufficient, generally indicated if an FiO2 (fraction of inspired oxygen) of 45% (which is about 6 Liters/minute via nasal cannula) or more is required to keep oxygen saturation (SpO2) at 90% or greater. Monitoring or Procedural Needs Beyond Program Capability: There is a need for invasive monitoring, such as a pulmonary artery catheter or an arterial line. There is a need for immediate-response telemetry monitoring (for dangerous arrhythmia detection and subsequent immediate intervention). The required medication regimen is beyond the capabilities of Hospital at Home (e.g., dosing intervals are too frequent for home administration). There is a need for a procedure that cannot be performed by the Hospital at [facility] team (e.g., significant wound debridement or abscess drainage for cellulitis, or percutaneous nephrostomy for a complicated UTI). Significant Organ Dysfunction or Markers of Severe Illness: Mental status is not at baseline, or there is altered mental status suggestive of inadequate perfusion. Renal (kidney) function is unstable or showing an ongoing decline. There is evidence of inadequate perfusion, such as metabolic acidosis or myocardial ischemia. Uncompensated acidosis is present. Condition-Specific Severity or Complications Making Home Care Unsuitable: For Heart Failure: Known severe cardiac valvular disease (e.g., aortic stenosis, mitral regurgitation); or severe peripheral edema that impairs the ability to urinate or ambulate. For COPD: Known concurrent comorbidity or finding that indicates a higher-risk COPD exacerbation (e.g.,  pulmonary fibrosis, cavitation, pleural effusion, pneumothorax, rib fracture). For Pneumonia: Pneumonia Severity Index (PSI) class V (indicating high risk for inpatient mortality); known concurrent comorbidity or finding that indicates higher-risk pneumonia (e.g., pulmonary fibrosis, cavitation, large or loculated pleural effusion); or a concomitant serious infectious process like endocarditis or empyema. For Cellulitis: Orbital, periorbital, or necrotizing infection is suspected; or a concomitant serious infectious process like endocarditis, septic emboli, or septic joint space infection. For UTI: Urinary tract obstruction (e.g., kidney stone, bladder outlet obstruction); or a concomitant serious infectious process like endocarditis or septic emboli. For Viral Illness & COVID-19: A concomitant serious infectious process like endocarditis or empyema.  General Comorbidities or Status:  The patient is significantly immunosuppressed (this applies to Pneumonia, Cellulitis, UTI, Viral Illness, and COVID-19). The patient meets inpatient admission criteria for a second diagnosis, or has care needs beyond the capabilities of Hospital at Home due to an active clinically significant comorbidity. (This is a general exclusion across all listed conditions)  Time spent: >60  minutes  Author: Elspeth JINNY Masters, MD 05/20/2024 3:27 PM  For on call review [email or web address].  "

## 2024-05-20 NOTE — Assessment & Plan Note (Signed)
 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

## 2024-05-20 NOTE — Evaluation (Signed)
 Physical Therapy Evaluation Patient Details Name: Sharon Wilkinson MRN: [id number] DOB: [birth year] Today's Date: 05/20/2024  History of Present Illness  Sharon Wilkinson is a 83 y.o. female who was sent from the urgent care center to the emergency department due to nonproductive cough, dyspnea and fatigue for the past 5 day,  severe hypertension, right basilar opacity suspicious for bronchopneumonia, Chronic hyperinflation.     PMH: osteoarthritis of multiple sites, right kidney cancer, right nephrectomy, left shoulder soft tissue mass, active smoker, COPD  Clinical Impression    Patient with noted desaturation without supplemental oxygen, See saturation walk test note.   Patient is a candidate for Hospital @ Home program.  PT will sign off. .         If plan is discharge home, recommend the following: A little help with bathing/dressing/bathroom;Help with stairs or ramp for entrance;Assist for transportation   Can travel by private vehicle        Equipment Recommendations None recommended by PT  Recommendations for Other Services       Functional Status Assessment Patient has had a recent decline in their functional status and demonstrates the ability to make significant improvements in function in a reasonable and predictable amount of time.     Precautions / Restrictions Precautions Precautions: Fall Recall of Precautions/Restrictions: Intact Restrictions Weight Bearing Restrictions Per Provider Order: No      Mobility  Bed Mobility Overal bed mobility: Independent                  Transfers Overall transfer level: Independent                      Ambulation/Gait Ambulation/Gait assistance: Supervision Gait Distance (Feet): 60 Feet (then 40') Assistive device: None Gait Pattern/deviations: Step-through pattern Gait velocity: decr Gait velocity interpretation: 1.31 - 2.62 ft/sec, indicative of limited community ambulator   General Gait Details: at times  will reach for support when turning  Stairs            Wheelchair Mobility     Tilt Bed    Modified Rankin (Stroke Patients Only)       Balance Overall balance assessment: Mild deficits observed, not formally tested                                           Pertinent Vitals/Pain Pain Assessment Pain Assessment: No/denies pain    Home Living Family/patient expects to be discharged to:: Private residence Living Arrangements: Spouse/significant other Available Help at Discharge: Family Type of Home: House Home Access: Stairs to enter   Secretary/administrator of Steps: 3   Home Layout: One level Home Equipment: None Additional Comments: spouse on RW post rehab for hip fracture    Prior Function Prior Level of Function : Independent/Modified Independent                     Extremity/Trunk Assessment   Upper Extremity Assessment Upper Extremity Assessment: Overall WFL for tasks assessed    Lower Extremity Assessment Lower Extremity Assessment: Generalized weakness    Cervical / Trunk Assessment Cervical / Trunk Assessment: Normal  Communication   Communication Communication: No apparent difficulties    Cognition Arousal: Alert Behavior During Therapy: WFL for tasks assessed/performed   PT - Cognitive impairments: No apparent impairments  Following commands: Intact       Cueing       General Comments      Exercises     Assessment/Plan    PT Assessment Patient does not need any further PT services  PT Problem List         PT Treatment Interventions      PT Goals (Current goals can be found in the Care Plan section)  Acute Rehab PT Goals Patient Stated Goal: go home PT Goal Formulation: All assessment and education complete, DC therapy    Frequency       Co-evaluation               AM-PAC PT 6 Clicks Mobility  Outcome Measure Help needed turning from your back  to your side while in a flat bed without using bedrails?: None Help needed moving from lying on your back to sitting on the side of a flat bed without using bedrails?: None Help needed moving to and from a bed to a chair (including a wheelchair)?: None Help needed standing up from a chair using your arms (e.g., wheelchair or bedside chair)?: None Help needed to walk in hospital room?: A Little Help needed climbing 3-5 steps with a railing? : A Little 6 Click Score: 22    End of Session Equipment Utilized During Treatment: Oxygen Activity Tolerance: Patient tolerated treatment well;Patient limited by fatigue Patient left: in bed;with call bell/phone within reach;with family/visitor present;with bed alarm set Nurse Communication: Mobility status PT Visit Diagnosis: Unsteadiness on feet (R26.81);Muscle weakness (generalized) (M62.81)    Time: 1110-1145 PT Time Calculation (min) (ACUTE ONLY): 35 min   Charges:   PT Evaluation $PT Eval Low Complexity: 1 Low PT Treatments $Gait Training: 8-22 mins PT General Charges $$ ACUTE PT VISIT: 1 Visit         Darice Potters PT Acute Rehabilitation Services Office [phone]   Potters Darice Norris 05/20/2024, 11:54 AM

## 2024-05-20 NOTE — Plan of Care (Signed)
°  Problem: Clinical Measurements: °Goal: Diagnostic test results will improve °Outcome: Progressing °  °Problem: Clinical Measurements: °Goal: Respiratory complications will improve °Outcome: Progressing °  °Problem: Clinical Measurements: °Goal: Cardiovascular complication will be avoided °Outcome: Progressing °  °Problem: Activity: °Goal: Risk for activity intolerance will decrease °Outcome: Progressing °  °Problem: Nutrition: °Goal: Adequate nutrition will be maintained °Outcome: Progressing °  °

## 2024-05-21 DIAGNOSIS — I160 Hypertensive urgency: Secondary | ICD-10-CM

## 2024-05-21 LAB — CBC
HCT: 49.4 % — ABNORMAL HIGH (ref 36.0–46.0)
Hemoglobin: 15.9 g/dL — ABNORMAL HIGH (ref 12.0–15.0)
MCH: 30.4 pg (ref 26.0–34.0)
MCHC: 32.2 g/dL (ref 30.0–36.0)
MCV: 94.5 fL (ref 80.0–100.0)
Platelets: 363 K/uL (ref 150–400)
RBC: 5.23 MIL/uL — ABNORMAL HIGH (ref 3.87–5.11)
RDW: 13.1 % (ref 11.5–15.5)
WBC: 14 K/uL — ABNORMAL HIGH (ref 4.0–10.5)
nRBC: 0 % (ref 0.0–0.2)

## 2024-05-21 LAB — HEMOGLOBIN A1C
Hgb A1c MFr Bld: 5.8 % — ABNORMAL HIGH (ref 4.8–5.6)
Mean Plasma Glucose: 119.76 mg/dL

## 2024-05-21 LAB — STREP PNEUMONIAE URINARY ANTIGEN: Strep Pneumo Urinary Antigen: NEGATIVE

## 2024-05-21 MED ORDER — METOPROLOL TARTRATE 25 MG PO TABS
25.0000 mg | ORAL_TABLET | Freq: Two times a day (BID) | ORAL | Status: DC | PRN
  Administered 2024-05-21 – 2024-05-23 (×3): 25 mg via ORAL
  Filled 2024-05-21 (×3): qty 1

## 2024-05-21 MED ORDER — LOSARTAN POTASSIUM 25 MG PO TABS
25.0000 mg | ORAL_TABLET | Freq: Every day | ORAL | Status: DC
  Administered 2024-05-21 – 2024-05-22 (×2): 25 mg via ORAL
  Filled 2024-05-21 (×3): qty 1

## 2024-05-21 NOTE — Progress Notes (Addendum)
 2027-2029- RN unable to reach patient via video call. Contact call via phone, Patient identifier reviewed, RN introduction completed. Patient noted RN calling on the tablet. Patient reported unable to answer due to preparing for blood pressure. Patient agreed to a call back on the tablet.   2030-Patient recorded blood pressure reading per [employer]: 167/95   HR-87  2036-Unable to reach patient via video call.  2054-Unable to reach patient via phone x2. Will continue to monitor and outreach to patient.   2106--Inbound call received, patient reported being ready for PRN medication assistance.   2107---Video Call- Patient identifiers reviewed. Patient Alert and Oriented, sitting in chair with Son present. Assessment and medication administration assistance completed for 2 PO PRN medications with RN oversight.  Patients denies pain or respiratory distress. Patient encouraged to consume Ensure. Patient reported preparing for bed and will try to drink to Ensure later. Patient confirmed all questions answered.  Overnight monitoring plan confirmed again. Patient and son encouraged to contact [facility] as needed.

## 2024-05-21 NOTE — Assessment & Plan Note (Addendum)
 Noted roughly 35 lb weight loss over the last several months per patient (nondescript)  Prealbumin low at 7  Underweight - Body mass index is 16.62 kg/m.  12/28 - results of CT chest/abd/pelvis yesterday shows a suspicious left upper lobe lung nodule concerning for malignancy, some hilar adenopathy (could also be seen in PNA), there was not evidence of metastatic disease in the abdomen/pelvis.  --Will need oncology follow up  12/29 - discussed above findings with patient and son during AM visit.

## 2024-05-21 NOTE — Hospital Course (Addendum)
 Sharon Wilkinson is a 83 y.o. female with medical history significant of osteoarthritis of multiple sites, right kidney cancer, right nephrectomy, left shoulder soft tissue mass, active smoker, COPD who was sent from the urgent care center to the emergency department due to nonproductive cough, dyspnea and fatigue for the past 5 days.  However, she was having severe hypertension and was referred to the emergency department.  She has also been having chronic diarrhea, but he has gotten worse in the last 5 days.  She denied fever, chills, rhinorrhea, sore throat, wheezing or hemoptysis.  No chest pain, palpitations, diaphoresis, PND, orthopnea or pitting edema of the lower extremities.  No abdominal pain, nausea, emesis, constipation, melena or hematochezia.  No flank pain, dysuria, frequency or hematuria.  No polyuria, polydipsia, polyphagia or blurred vision.    Lab work: CBC showed a white count of 13.7, hemoglobin 17.3 g/dL and platelets 728.  proBNP was 1830.0 pg/mL.  CMP was unremarkable with the assassin of a nonfasting glucose of 125 mg/dL.   Imaging: Portable 1 view chest radiograph showing normal heart size, right basilar opacity suspicious for bronchopneumonia.  Chronic hyperinflation.   ED course: Initial vital signs were temperature 98.7 F, pulse 72, respiration 18, BP 190/112 mmHg and O2 sat 91% on room air.  The patient subsequently had hypoxia going down to 84% on room air.  She was placed on nasal cannula oxygen and her most recent O2 sat was 93% on [place] at 2 LPM.  She received 125 mg of Solu-Medrol  and a DuoNeb.  05/19/2024 - admitted to hospital. Started on IV Rocephin , Zithromax , high dose IV steroid, neb treatments etc.  05/20/2024 - transferred to [facility] program Day 2 antibiotics & IV steroids  05/21/2024 - remains on 2 L/min O2 with sats 90-93%. SOB & cough improving. Day 3 antibiotics & steroids (PO prednisone )  05/22/2024 - remains on 2 L/min O2 with sats low 90's.  Qualifies for home  O2 with today's ambulatory O2 sat dropping  to 87% on room air.  Day 4 antibiotics & steroids  05/23/2024 - remains on 2 l/min with spO2 90-95%.  Reports more coughing, worse with ambulation, otherwise feeling better. Afternoon visit spO2 88% on 2 L -- increased to 3 L/min.  Considered chest x-ray but pt declines for now. Day 5/5 antibiotics.  Start tapering prednisone  tomorrow.    12/31: I assumed care of patient. Patient discharged home with antihypertensive medications. Repeat BP after blood pressure medications were 160/82. Patient discharged from [facility] and extensive discussion regarding follow-up with PCP was advised.

## 2024-05-21 NOTE — Assessment & Plan Note (Signed)
"  See above.    "

## 2024-05-21 NOTE — Progress Notes (Signed)
 This EMT went to pt's home to obtain lab work. Upon arrival I was met by [facility]. The pt was awake and alert and resting comfortably in her recliner. Labs were obtained at this time. No issues to report and pt tolerated it well. Labs were then transported to [facility]. At this time labs have been sent to the lab for testing.

## 2024-05-21 NOTE — Progress Notes (Signed)
 On virtual call with patient, scientist, physiological, provider, and Tax Inspector. Discussing elevated BP.provider added order for losartan  to treat  Patient reports feeling better, slept well, cough improved. and no distress with breathing. Discussed potential for continued O2 use depending on Sats for next couple days. . Resources offered for smoking cessation patient shares planning to not start back up and is using patch. Also patient will be on Abx 5 days today is day 3.  Sharon Wilkinson would also like a 2 wheel walker sending note to CM.

## 2024-05-21 NOTE — Progress Notes (Signed)
 On virtual call with patient and medic. Discuss with patient taking benadryl  and fall risk she verbalized understanding. Patient also practiced taking BP left note for pm RN to take BP with patient this evening. Patient also had neb Tx this evening. Dual sign Metoprolol  PRN BP 181/98. Provider notified. Patient denies palpitations/chest pressure/pain or shortness of breath.

## 2024-05-21 NOTE — Plan of Care (Signed)
" °  Problem: Clinical Measurements: Goal: Respiratory complications will improve Outcome: Progressing   Problem: Clinical Measurements: Goal: Diagnostic test results will improve Outcome: Progressing   Problem: Clinical Measurements: Goal: Will remain free from infection Outcome: Progressing   Problem: Clinical Measurements: Goal: Ability to maintain clinical measurements within normal limits will improve Outcome: Progressing   Problem: Nutrition: Goal: Adequate nutrition will be maintained Outcome: Progressing   Problem: Elimination: Goal: Will not experience complications related to bowel motility Outcome: Progressing Goal: Will not experience complications related to urinary retention Outcome: Progressing   Problem: Pain Managment: Goal: General experience of comfort will improve and/or be controlled Outcome: Progressing   Problem: Safety: Goal: Ability to remain free from injury will improve Outcome: Progressing   Problem: Skin Integrity: Goal: Risk for impaired skin integrity will decrease Outcome: Progressing   Problem: Education: Goal: Knowledge of disease or condition will improve Outcome: Progressing Goal: Knowledge of the prescribed therapeutic regimen will improve Outcome: Progressing Goal: Individualized Educational Video(s) Outcome: Progressing   "

## 2024-05-21 NOTE — Progress Notes (Signed)
 Called patient 3x this am confirmed number with patient and she provided [phone]. Does not match initial provided changed in [facility] paper chart.

## 2024-05-21 NOTE — Progress Notes (Signed)
"  H@H  medic arrived at patients residence and was welcomed in by patient and family. On assessment, lung sounds show expiratory wheezing in (R) base and diminished in the (L) apex and RML. See flow sheet for vitals and other assessment values. Abx were started and the patient also received a Duoneb for wheezing. Lung sounds post Duoneb revealed same wheezing in (R) base with mild improvement. Education was provided on how to use blood pressure cuff from [facility] kit. Pt was also instructed on how to use flutter valve and pt was seen using such. No other tasks were needed at this time. H@H  visit complete. "

## 2024-05-21 NOTE — TOC Progression Note (Addendum)
 Transition of Care [facility]) - Progression Note    Patient Details  Name: Sharon Wilkinson MRN: [id number] Date of Birth: May 02, 1941  [facility] CM/SW Contact  Corean JAYSON Canary, RN Phone Number: 05/21/2024, 2:16 PM  Clinical Narrative:      Discussed in ID rounds Patient is requesting a RW 2 wheel. Placed in HUB Patient is still requiring oxygen 2-3 L Will need oxygen qualifications starting tomorrow if not weaned  IPCM will monitor for  needs possible DC date Tuesday                    Expected Discharge Plan and Services                                               Social Drivers of Health (SDOH) Interventions SDOH Screenings   Food Insecurity: No Food Insecurity (05/19/2024)  Housing: Low Risk (05/19/2024)  Transportation Needs: No Transportation Needs (05/19/2024)  Utilities: Not At Risk (05/19/2024)  Social Connections: Moderately Isolated (05/19/2024)  Tobacco Use: High Risk (05/19/2024)    Readmission Risk Interventions     No data to display

## 2024-05-21 NOTE — Assessment & Plan Note (Signed)
 Patient completed 3 days of azithromycin  500 mg on 12/28 and ceftriaxone  on 12/30.

## 2024-05-21 NOTE — Assessment & Plan Note (Signed)
 SABRA

## 2024-05-21 NOTE — Assessment & Plan Note (Signed)
 Mgmt as above

## 2024-05-21 NOTE — Progress Notes (Signed)
 Pt seen for routine HatH morning visit. Pt appears well and is found seated in recliner in living room. Pt's son is present. Pt states she slept well other than her O2 concentrator being somewhat loud.   Vital signs obtained as noted. Pt is still hypertensive with a manual BP of 182/96. RN and MD notified. CBG was 137. Assesment obtained. Lung sounds are diminished in all fields. Pt has a non-productive cough but it sounds loose when she coughs in front of me. Pt denies SOB when walking or at rest. Pt denies any chest tightness or wheezing. Heart tones are s1, s2 and pulses are strong and regular. Bowel sounds are active, abdomen is soft and non-tender. Pt's last bm, was yesterday but she states she just woke up before my arrival. No peripheral edema noted. Pt denies any difficulty urinating. Pt has not been able to produce any sputum for sample ordered but has a specimen cup and will collect same if she can today. Pt has I/S and flutter valve and has been shown how to use same. Pt states she will use them throughout the day.   Pt had virtual visit with Dr. Fausto and Darice, RN. Plan of care discussed.  Pt and son shown how to use blood pressure cuff and would like virtual RN to call them to take it between this visit and afternoon visit.   Medications administered as noted.  IV care completed including new curos cap. Labs drawn and transported to [employer] by [place], EMT.   Pt encouraged to call with any problems or questions throughout the day and knows to expect a visit this afternoon.

## 2024-05-21 NOTE — Assessment & Plan Note (Signed)
 proBNP 1830 on admission Clinically not volume overloaded. 12/30 - O2 had to be increased 2 >> 3 L this afternoon with borderline O2 sats.  She does not appears volume overload peripherally.  Pt declined to go get a CXR this afternoon.   --Will check a repeat proBNP for trend

## 2024-05-21 NOTE — Progress Notes (Addendum)
 "  Hospital at Home Daily Progress Note   Patient: Sharon Wilkinson  MRN: [id number]  DOB: [birth year]  DOA: 05/19/2024  DOS: the patient was seen and examined on @TODAY @    Patient identified themself as Sharon Wilkinson  DOB 1941-03-18  Medic Lauraine Faes present in the home during encounter and performed the assessment and physical exam. Patient was seen today via video conference; my physical location [place]   Brief hospital course:  DESA RECH is a 83 y.o. female with medical history significant of osteoarthritis of multiple sites, right kidney cancer, right nephrectomy, left shoulder soft tissue mass, active smoker, COPD who was sent from the urgent care center to the emergency department due to nonproductive cough, dyspnea and fatigue for the past 5 days.  However, she was having severe hypertension and was referred to the emergency department.  She has also been having chronic diarrhea, but he has gotten worse in the last 5 days.  She denied fever, chills, rhinorrhea, sore throat, wheezing or hemoptysis.  No chest pain, palpitations, diaphoresis, PND, orthopnea or pitting edema of the lower extremities.  No abdominal pain, nausea, emesis, constipation, melena or hematochezia.  No flank pain, dysuria, frequency or hematuria.  No polyuria, polydipsia, polyphagia or blurred vision.    Lab work: CBC showed a white count of 13.7, hemoglobin 17.3 g/dL and platelets 728.  proBNP was 1830.0 pg/mL.  CMP was unremarkable with the assassin of a nonfasting glucose of 125 mg/dL.   Imaging: Portable 1 view chest radiograph showing normal heart size, right basilar opacity suspicious for bronchopneumonia.  Chronic hyperinflation.   ED course: Initial vital signs were temperature 98.7 F, pulse 72, respiration 18, BP 190/112 mmHg and O2 sat 91% on room air.  The patient subsequently had hypoxia going down to 84% on room air.  She was placed on nasal cannula oxygen and her most recent O2 sat was 93% on [place] at  2 LPM.  She received 125 mg of Solu-Medrol  and a DuoNeb.  05/19/2024 - admitted to hospital. Started on IV Rocephin , Zithromax , high dose IV steroid, neb treatments etc.  05/20/2024 - transferred to [facility] program Day 2 antibiotics & IV steroids  05/21/2024 - remains on 2 L/min O2 with sats 90-93%. SOB & cough improving. Day 3 antibiotics & steroids (PO prednisone )     Assessment and Plan:  * Acute respiratory failure with hypoxia (HCC) COPD Exacerbation  PNA   Decompensated resp status now requiring 2-3L [place] to keep O2 sats >92%  Noted R basilar PNA on CXR  + coughing, wheezing and increased sputum production in setting of extensive tobacco history and ? Recent diagnosis of obstructive lung disease  12/28 - day 3 antibiotics & steroids --Continue IV rocephin  and azithromycin  for resp coverage  --Initial high dose IV steroid - now on prednisone  50 mg daily  --Prn duonebs  --Cont supplemental O2 to keep sats > 90% --IS & Flutter valve  --Will need outpatient pulmonary follow up   Chronic diarrhea Reports chronic intermittent diarrhea without frank bloody or bilious morphology  Now resolved per patient  No decreased appetite --GI consulted- appreciate input  --CT A&P pending to better assess- though contrasted study would be of greater benefit (deferrred in setting of hx/o nephrectomy) Pt declined stool studies per report  --Dietary consult placed  --Added on ensure supplementation  --Will otherwise monitor-follow up recommendations   History of nephrectomy, right Noted prior history of RCC s/p R nephrectomy  12/28 -  Cr 0.58 today  --Will monitor  --Try to avoid nephrotoxins in setting of solitary functioning kidney    Chronic heart failure with preserved ejection fraction (HFpEF) (HCC) Noted prior history of elevated BNP  ProBNP 1800 12/26  2D ECHO 05/19/24 w/ EF 60-65% and grade 1 diastolic dysfunction  Appears euvolemic  --Monitor volume status  --Outpatient  cardiology follow up.     COPD with acute exacerbation (HCC) Mgmt as above  CAP (community acquired pneumonia) .  Unintentional weight loss Noted roughly 35 lb weight loss over the last several months per patient (nondescript)  Prealbumin low at 7  Underweight - Body mass index is 16.62 kg/m.  12/28 - results of CT chest/abd/pelvis yesterday shows a suspicious left upper lobe lung nodule concerning for malignancy, some hilar adenopathy (could also be seen in PNA), there was not evidence of metastatic disease in the abdomen/pelvis.  --Will need oncology follow up  Aortic atherosclerosis .  Elevated brain natriuretic peptide (BNP) level Clinically not volume overloaded  Polycythemia secondary to smoking 12/28 Hgb 15.9 today  Looks to be near baseline  --Follow CBC  Steroid-induced hyperglycemia Blood sugar in 180s Suspect element of steroid induced hyperglycemia  --Check CBG's BID & glucose on labs --A1C is 5.8% --Anticipate decreased w/ downtrending steroid use.  --Consider low dose lantus vs. Glipizide in HatH setting as appropriate 12/28 - no coverage indicated at this time, will add if glucose running > 180  Underweight See above  Tobacco use 1/2 PPD smoker  Cessation was discussed at length on admission - especially in setting of hospital at home program agreement and supplemental O2 use  Pt in full agreement with tobacco abstinence  Discussed risks initially with patient and O2 use- I know....BOOM Son also sequestering cigarettes  --Nicotine  patch in place  12/28 - pt reports doing well, not craving nicotine , expresses desire to stop smoking  Hypertensive urgency Iniital BPs 160s-200s/90s-100s  Has had no consistent outpatient follow up  Initially improved w/ prn IV hydralazine  --Norvasc  increased 2.5--> 5mg  daily --Will monitor  --Start losartan  25 mg daily --PRN metoprolol  --Titrate regimen --Steroids likely contributing to BP elevation      Patient Active Problem List   Diagnosis Date Noted   Acute respiratory failure with hypoxia (HCC) 05/19/2024    Priority: 1.   Chronic diarrhea 05/19/2024    Priority: 2.   Chronic heart failure with preserved ejection fraction (HFpEF) (HCC) 05/20/2024   History of nephrectomy, right 05/20/2024   Hypertensive urgency 05/19/2024   Tobacco use 05/19/2024   Underweight 05/19/2024   Steroid-induced hyperglycemia 05/19/2024   Polycythemia secondary to smoking 05/19/2024   Elevated brain natriuretic peptide (BNP) level 05/19/2024   Aortic atherosclerosis 05/19/2024   Unintentional weight loss 05/19/2024   CAP (community acquired pneumonia) 05/19/2024   COPD with acute exacerbation (HCC) 05/19/2024   Mass of soft tissue of left upper extremity 01/22/2019   Smoker 01/22/2013      Significant Events: Admitted 05/19/2024    Subjective / Interval 24 hour History:   Patient seen virtually during a.m. medic visit to the home this morning.  She reports sleeping well overnight.  Reports that her shortness of breath has improved, she no longer feels short of breath.  Coughing somewhat less today.  Denies fevers or chills.  She reports having a little bit of energy back, feeling less fatigued all..  Feels like wheezing is improved.  She expresses motivation to stop smoking going forward and denies having any cravings at this  point.     Admission Labs: CMP notable for glucose 125 WBC 13.7 Hemoglobin 17.3 proBNP 1830  Admission Imaging Studies: Portable chest x-ray 05/19/2024 --Right basilar opacity suspicious for bronchopneumonia -- Chronic hyperinflation  Significant Labs: 12/28 -CMP notable for glucose 180, BUN 24 (on steroids) WBC trend 13.7 >> 16.3 >> 14.0 (on steroids) Hemoglobin A1c 5.8%  Strep pneumo urinary antigen negative Respiratory PCR panel negative  Significant Imaging Studies: CT chest abdomen pelvis June 07, 2024 -- Consolidative nodular airspace disease in the  dependent right lower lobe with surrounding ground glass opacities consistent with infection -- Spiculated nodule in the left upper lobe measuring 1.7 x 1.2 cm, suspicious for primary bronchogenic malignancy -- Soft tissue density at the left hilum and narrowing of the left upper lobe bronchus poorly assessed in the absence of IV contrast suspicious for hilar adenopathy -- No evidence of metastatic disease in the abdomen or pelvis -- Chronic left UPJ obstruction -- Colonic diverticulosis without diverticulitis  Antibiotic Therapy: Anti-infectives (From admission, onward)    Start     Dose/Rate Route Frequency Ordered Stop   05/21/24 1000  cefTRIAXone  (ROCEPHIN ) 1 g in sodium chloride  0.9 % 100 mL IVPB        1 g 200 mL/hr over 30 Minutes Intravenous Every 24 hours 06/07/2024 1536 05/24/24 0959   06/07/24 1200  cefTRIAXone  (ROCEPHIN ) 1 g in sodium chloride  0.9 % 100 mL IVPB  Status:  Discontinued        1 g 200 mL/hr over 30 Minutes Intravenous Every 24 hours 05/19/24 1201 06/07/2024 1536   06/07/24 1100  azithromycin  (ZITHROMAX ) 500 mg in sodium chloride  0.9 % 250 mL IVPB        500 mg 250 mL/hr over 60 Minutes Intravenous Every 24 hours 05/19/24 1201 05/24/24 1759   05/19/24 1145  cefTRIAXone  (ROCEPHIN ) 1 g in sodium chloride  0.9 % 100 mL IVPB        1 g 200 mL/hr over 30 Minutes Intravenous  Once 05/19/24 1133 05/19/24 1216   05/19/24 1145  azithromycin  (ZITHROMAX ) tablet 500 mg        500 mg Oral  Once 05/19/24 1133 05/19/24 1146       Procedures: None  Consultants: None          Physical Exam:    05/21/2024    9:15 AM 05/21/2024    9:00 AM 05/21/2024    4:00 AM  Vitals with BMI  Weight  99 lbs 14 oz   BMI  16.62   Systolic 182 199   Diastolic 98 101   Pulse  90 76   Bedside physical exam was performed by RN listed above. Below exam findings are based on their in person physical exam findings and my observations during virtual encounter.  General exam: awake,  alert, no acute distress HEENT: Voice clear, hearing grossly normal  Respiratory system: Lungs diminished in all fields, loose productive sounding cough without sputum production, normal respiratory effort at rest, on 2 L/min [place] O2. Cardiovascular system: normal S1/S2, RRR, radial pulses strong symmetric Gastrointestinal system: soft, NT, ND,  +bowel sounds. Central nervous system: A&O x3. no gross focal neurologic deficits, normal speech Psychiatry: normal mood, congruent affect, judgement and insight appear normal    Data Reviewed:  As reviewed above   Family Communication: Son was present in the home during virtual encounter this morning  Disposition: Status is: Inpatient Remains inpatient appropriate because: Remains on IV antibiotics pending further clinical improvement, still requiring 2 L/min nasal  cannula oxygen without baseline requirement for O2    Planned Discharge Destination: Home    Time spent: 45 minutes  Author: Burnard DELENA Cunning, DO [facility]  01/13/2024 7:00 AM  For on call review [email or web address].   "

## 2024-05-22 DIAGNOSIS — R911 Solitary pulmonary nodule: Secondary | ICD-10-CM

## 2024-05-22 DIAGNOSIS — E43 Unspecified severe protein-calorie malnutrition: Secondary | ICD-10-CM

## 2024-05-22 LAB — COMPREHENSIVE METABOLIC PANEL WITH GFR
ALT: 38 U/L (ref 0–44)
AST: 45 U/L — ABNORMAL HIGH (ref 15–41)
Albumin: 3.8 g/dL (ref 3.5–5.0)
Alkaline Phosphatase: 106 U/L (ref 38–126)
Anion gap: 10 (ref 5–15)
BUN: 24 mg/dL — ABNORMAL HIGH (ref 8–23)
CO2: 33 mmol/L — ABNORMAL HIGH (ref 22–32)
Calcium: 8.7 mg/dL — ABNORMAL LOW (ref 8.9–10.3)
Chloride: 98 mmol/L (ref 98–111)
Creatinine, Ser: 0.58 mg/dL (ref 0.44–1.00)
GFR, Estimated: >60 mL/min
Glucose, Bld: 109 mg/dL — ABNORMAL HIGH (ref 70–99)
Potassium: 3.9 mmol/L (ref 3.5–5.1)
Sodium: 142 mmol/L (ref 135–145)
Total Bilirubin: 0.5 mg/dL (ref 0.0–1.2)
Total Protein: 7.8 g/dL (ref 6.5–8.1)

## 2024-05-22 LAB — CBC
HCT: 48.9 % — ABNORMAL HIGH (ref 36.0–46.0)
Hemoglobin: 16 g/dL — ABNORMAL HIGH (ref 12.0–15.0)
MCH: 31 pg (ref 26.0–34.0)
MCHC: 32.7 g/dL (ref 30.0–36.0)
MCV: 94.8 fL (ref 80.0–100.0)
Platelets: 343 K/uL (ref 150–400)
RBC: 5.16 MIL/uL — ABNORMAL HIGH (ref 3.87–5.11)
RDW: 13 % (ref 11.5–15.5)
WBC: 13.1 K/uL — ABNORMAL HIGH (ref 4.0–10.5)
nRBC: 0 % (ref 0.0–0.2)

## 2024-05-22 MED ORDER — AMLODIPINE BESYLATE 10 MG PO TABS
10.0000 mg | ORAL_TABLET | Freq: Every day | ORAL | Status: DC
  Administered 2024-05-22 – 2024-05-24 (×3): 10 mg via ORAL
  Filled 2024-05-22 (×4): qty 1

## 2024-05-22 MED ORDER — LOSARTAN POTASSIUM 50 MG PO TABS
50.0000 mg | ORAL_TABLET | Freq: Every day | ORAL | Status: DC
  Administered 2024-05-23 – 2024-05-24 (×2): 50 mg via ORAL
  Filled 2024-05-22 (×3): qty 1

## 2024-05-22 MED ORDER — AZITHROMYCIN 500 MG PO TABS
500.0000 mg | ORAL_TABLET | Freq: Every day | ORAL | Status: AC
  Administered 2024-05-22 – 2024-05-23 (×2): 500 mg via ORAL
  Filled 2024-05-22 (×2): qty 1

## 2024-05-22 MED ORDER — LOSARTAN POTASSIUM 25 MG PO TABS
25.0000 mg | ORAL_TABLET | Freq: Once | ORAL | Status: AC
  Administered 2024-05-22: 25 mg via ORAL
  Filled 2024-05-22: qty 1

## 2024-05-22 NOTE — Progress Notes (Signed)
 Pt seen for routine HatH morning visit. Pt appears well and states she slept well. Pt's son present.  Vital signs and assessment obtained as noted. Pt is still hypertensive and RN and MD notified. Lung sounds reveal inspiratory and expiratory wheezes with some rhonchi in right upper field, left upper field with rhonchi and expiratory wheeze, both lower fields are diminished with slight expiratory wheeze in the left lower field. Pt states her cough is non-productive but sounds loose. Heart tones are s1, s2 with strong regular pulses. Abdomen is soft and non tender in all quadrants with active bowel sounds. Pt states she has not had a bm in 2 days but doesn't have any pain and doesn't feel like she is constipated at this point.   Medications administered including prn neb for wheezing.   IV care completed including new curos cap. Labs drawn.   Pt had virtual visit with Darice, RN and Dr. Fausto.   Ambulatory sats for O2 qualification completed and sent to RN and MD for documentation.   Pt encouraged to call with any problems or questions.

## 2024-05-22 NOTE — Assessment & Plan Note (Signed)
 Referral to Pulmonology has been sent.  Discussed case with Dr. Sherrod with oncology - he recommended send to pulmonary for potential biopsy, and they will refer to Oncology if needed if malignancy is confirmed.  Given weight loss and spiculated appearance, concerning for malignancy.

## 2024-05-22 NOTE — Progress Notes (Addendum)
 On virtual call with patient and niece taking BP  206/93. Pulse 106. Patient given metoprolol  25mg  PRN PO.Provider notified.

## 2024-05-22 NOTE — Progress Notes (Signed)
 Repeat BP 189/105. Provider notified.

## 2024-05-22 NOTE — Progress Notes (Signed)
 Call to son, Dale with ETA of paramedic.

## 2024-05-22 NOTE — Progress Notes (Addendum)
 SATURATION QUALIFICATIONS: (This note is used to comply with regulatory documentation for home oxygen)  Patient Saturations on Room Air at Rest = 91%  Patient Saturations on Room Air while Ambulating = 87%  Patient Saturations on 2 Liters of oxygen while Ambulating = 92%  Please briefly explain why patient needs home oxygen Patient at baseline without O2 demonstrates inadequate oxygen delivery.

## 2024-05-22 NOTE — TOC Progression Note (Signed)
 Transition of Care [facility]) - Progression Note    Patient Details  Name: Sharon Wilkinson MRN: [id number] Date of Birth: June 27, 1940  [facility] CM/SW Contact  Debarah Saunas, RN Phone Number: 05/22/2024, 11:32 AM  Clinical Narrative:    Pt to complete ambulation oxygen qualification challenge this AM.  RNCM will follow and order if pt qualifies.   Expected Discharge Plan: Home w Home Health Services Barriers to Discharge: Continued Medical Work up               Expected Discharge Plan and Services   Discharge Planning Services: CM Consult   Living arrangements for the past 2 months: Single Family Home                 DME Arranged: Walker rolling DME Agency: [employer]                   Social Drivers of Health (SDOH) Interventions SDOH Screenings   Food Insecurity: No Food Insecurity (05/19/2024)  Housing: Low Risk (05/19/2024)  Transportation Needs: No Transportation Needs (05/19/2024)  Utilities: Not At Risk (05/19/2024)  Social Connections: Moderately Isolated (05/19/2024)  Tobacco Use: High Risk (05/19/2024)    Readmission Risk Interventions     No data to display

## 2024-05-22 NOTE — Progress Notes (Signed)
 Initial Nutrition Assessment  DOCUMENTATION CODES:  Severe malnutrition in context of chronic illness, Underweight  INTERVENTION:  Continue Ensure Plus High Protein po TID, each supplement provides 350 kcal and 20 grams of protein MVI with minerals daily, patient to buy over the counter Suggestions For Increasing Calories And Protein handout added to discharge instructions and discussed with patient.   NUTRITION DIAGNOSIS:  Severe Malnutrition related to chronic illness (COPD, diarrhea x 4 months) as evidenced by energy intake < or equal to 75% for > or equal to 1 month, percent weight loss (20% weight loss within 6 months).  GOAL:  Patient will meet greater than or equal to 90% of their needs  MONITOR:  PO intake, Supplement acceptance  REASON FOR ASSESSMENT:  Consult Assessment of nutrition requirement/status  ASSESSMENT:  83 yo female admitted with acute respiratory failure with hypoxia, COPD exacerbation, PNA. PMH includes R kidney cancer S/P R nephrectomy, arthritis, smoker, COPD.  12/27: CT chest/abd/pelvis showed suspicious LUL nodule concerning for malignancy, no evidence of metastatic disease in abd/pelvis; will need follow-up with oncology per MD note  Spoke with patient over the phone. She reports having diarrhea for the past 4 months. She is unsure why she has had diarrhea, but it stopped a few days ago. She has lost from 125 lbs to 100 lbs over the past 4 months. 20% weight loss within 6 months is significant for the time frame. She reports poor appetite with very little intake for about 5 days prior to hospitalization. Usual intake is nothing for breakfast or lunch. She eats a Engineer, site for a snack sometime during breakfast or lunch time. She usually eats a small dinner unless she goes out to eat with her sister, which she hasn't been doing recently.   Patient meets criteria for severe malnutrition, given 20% weight loss within 6 months and intake meeting < 75% of  estimated energy requirement for > 1 month.  Discussed with patient ways to increase intake of calories and protein. She likes the Ensure supplements and agreed to drink at least 2 per day.   Labs reviewed.  Prealbumin 7 A1C 5.8  Medications reviewed and include prednisone .    NUTRITION - FOCUSED PHYSICAL EXAM: Unable to complete  Diet Order:   Diet Order     None       EDUCATION NEEDS:  Education needs have been addressed  Skin:  Skin Assessment: Reviewed RN Assessment  Last BM:  12/27 type 6  Height:  Ht Readings from Last 1 Encounters:  05/19/24 5' 5 (1.651 m)   Weight:  Wt Readings from Last 1 Encounters:  05/22/24 46.9 kg   Ideal Body Weight:  56.8 kg  BMI:  Body mass index is 17.19 kg/m.  Estimated Nutritional Needs:  Kcal:  1400-1600 Protein:  70-80 gm Fluid:  1.4-1.6 L   Suzen HUNT RD, LDN, CNSC Contact via secure chat. If unavailable, use group chat RD Inpatient.

## 2024-05-22 NOTE — Discharge Instructions (Signed)

## 2024-05-22 NOTE — Assessment & Plan Note (Signed)
 chronic illness (COPD, diarrhea x 4 months) as evidenced by energy intake < or equal to 75% for > or equal to 1 month, percent weight loss (20% weight loss within 6 months).  Body mass index is 17.19 kg/m. --Appreciate dietitian recommendations --Ensures, MVI --Handout added to d/c instructions & RD discussed with patient

## 2024-05-22 NOTE — Progress Notes (Addendum)
 "  Hospital at Home Daily Progress Note   Patient: Sharon Wilkinson  MRN: [id number]  DOB: 10-Mar-1941  DOA: 05/19/2024  DOS: the patient was seen and examined on @TODAY @    Patient identified themself as Sharon Wilkinson  DOB 10-May-1941  Medic Sharon Wilkinson present in the home during encounter and performed the assessment and physical exam. Patient was seen today via video conference; my physical location [place]   Brief hospital course:  Sharon Wilkinson is a 83 y.o. female with medical history significant of osteoarthritis of multiple sites, right kidney cancer, right nephrectomy, left shoulder soft tissue mass, active smoker, COPD who was sent from the urgent care center to the emergency department due to nonproductive cough, dyspnea and fatigue for the past 5 days.  However, she was having severe hypertension and was referred to the emergency department.  She has also been having chronic diarrhea, but he has gotten worse in the last 5 days.  She denied fever, chills, rhinorrhea, sore throat, wheezing or hemoptysis.  No chest pain, palpitations, diaphoresis, PND, orthopnea or pitting edema of the lower extremities.  No abdominal pain, nausea, emesis, constipation, melena or hematochezia.  No flank pain, dysuria, frequency or hematuria.  No polyuria, polydipsia, polyphagia or blurred vision.    Lab work: CBC showed a white count of 13.7, hemoglobin 17.3 g/dL and platelets 728.  proBNP was 1830.0 pg/mL.  CMP was unremarkable with the assassin of a nonfasting glucose of 125 mg/dL.   Imaging: Portable 1 view chest radiograph showing normal heart size, right basilar opacity suspicious for bronchopneumonia.  Chronic hyperinflation.   ED course: Initial vital signs were temperature 98.7 F, pulse 72, respiration 18, BP 190/112 mmHg and O2 sat 91% on room air.  The patient subsequently had hypoxia going down to 84% on room air.  She was placed on nasal cannula oxygen and her most recent O2 sat was 93% on [place] at  2 LPM.  She received 125 mg of Solu-Medrol  and a DuoNeb.  05/19/2024 - admitted to hospital. Started on IV Rocephin , Zithromax , high dose IV steroid, neb treatments etc.  05/20/2024 - transferred to [facility] program Day 2 antibiotics & IV steroids  05/21/2024 - remains on 2 L/min O2 with sats 90-93%. SOB & cough improving. Day 3 antibiotics & steroids (PO prednisone )  05/22/2024 - remains on 2 L/min O2 with sats low 90's.  Qualifies for home O2 with today's ambulatory O2 sat dropping  to 87% on room air.   Day 4 antibiotics & steroids     Assessment and Plan:  * Acute respiratory failure with hypoxia (HCC) COPD Exacerbation  PNA   Decompensated resp status now requiring 2-3L [place] to keep O2 sats >92%  Noted R basilar PNA on CXR  + coughing, wheezing and increased sputum production in setting of extensive tobacco history and ? Recent diagnosis of obstructive lung disease  12/28 - day 3 antibiotics & steroids --Continue IV rocephin  and azithromycin  for resp coverage  --Initial high dose IV steroid - now on prednisone  50 mg daily  --Prn duonebs  --Cont supplemental O2 to keep sats > 90% --IS & Flutter valve  --Home O2 qualification today & order home O2 for d/c --Will need outpatient pulmonary follow up   Chronic diarrhea Reports chronic intermittent diarrhea without frank bloody or bilious morphology  Now resolved per patient  No decreased appetite --GI consulted- appreciate input  --CT A&P pending to better assess- though contrasted study would be of  greater benefit (deferrred in setting of hx/o nephrectomy) Pt declined stool studies per report  --Dietary consult placed  --Added on ensure supplementation  --Will otherwise monitor-follow up recommendations   Nodule of upper lobe of left lung Referral to Pulmonology has been sent.  Discussed case with Dr. Sherrod with oncology - he recommended send to pulmonary for potential biopsy, and they will refer to Oncology if needed if  malignancy is confirmed.  Given weight loss and spiculated appearance, concerning for malignancy.  Protein-calorie malnutrition, severe chronic illness (COPD, diarrhea x 4 months) as evidenced by energy intake < or equal to 75% for > or equal to 1 month, percent weight loss (20% weight loss within 6 months).  Body mass index is 17.19 kg/m. --Appreciate dietitian recommendations --Ensures, MVI --Handout added to d/c instructions & RD discussed with patient   History of nephrectomy, right Noted prior history of RCC s/p R nephrectomy  12/29 - Cr 0.58 today  --Will monitor  --Try to avoid nephrotoxins in setting of solitary functioning kidney    Chronic heart failure with preserved ejection fraction (HFpEF) (HCC) Noted prior history of elevated BNP  ProBNP 1800 12/26  2D ECHO 05/19/24 w/ EF 60-65% and grade 1 diastolic dysfunction  Appears euvolemic  --Monitor volume status  --Outpatient cardiology follow up.     COPD with acute exacerbation (HCC) Mgmt as above  CAP (community acquired pneumonia) .  Unintentional weight loss Noted roughly 35 lb weight loss over the last several months per patient (nondescript)  Prealbumin low at 7  Underweight - Body mass index is 16.62 kg/m.  12/28 - results of CT chest/abd/pelvis yesterday shows a suspicious left upper lobe lung nodule concerning for malignancy, some hilar adenopathy (could also be seen in PNA), there was not evidence of metastatic disease in the abdomen/pelvis.  --Will need oncology follow up  12/29 - discussed above findings with patient and son during AM visit.  Aortic atherosclerosis .  Elevated brain natriuretic peptide (BNP) level Clinically not volume overloaded  Polycythemia secondary to smoking 12/29 Hgb 16.0 today  Looks to be near baseline  --Follow CBC  Steroid-induced hyperglycemia Blood sugar in 180s  12/29 - improved to 109 on this AM's labs Suspect element of steroid induced hyperglycemia   --Check CBG's BID & glucose on labs --A1C is 5.8% --Anticipate decreased w/ downtrending steroid use.  --Consider low dose lantus vs. 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 PPD smoker  Cessation was discussed at length on admission - especially in setting of hospital at home program agreement and supplemental O2 use  Pt in full agreement with tobacco abstinence  Discussed risks initially with patient and O2 use- I know....BOOM Son also sequestering cigarettes  --Nicotine  patch in place  12/28 - pt reports doing well, not craving nicotine , expresses desire to stop smoking  Hypertensive urgency Iniital BPs 160s-200s/90s-100s  Has had no consistent outpatient follow up  Initially improved w/ prn IV hydralazine  12/29 - BP's still very elevated --Increase Norvasc  5 >> 10 mg daily --Losartan  25 mg added yesterday --Will monitor closely  --PRN metoprolol  --Titrate regimen --Steroids likely contributing to BP elevation --Pt does not follow with physicians, suspect undiagnosed chronic HTN     Patient Active Problem List   Diagnosis Date Noted   Acute respiratory failure with hypoxia (HCC) 05/19/2024  Priority: 1.   Chronic diarrhea 05/19/2024    Priority: 2.   Protein-calorie malnutrition, severe 05/22/2024   Nodule of upper lobe of left lung 05/22/2024   Chronic heart failure with preserved ejection fraction (HFpEF) (HCC) 06-07-2024   History of nephrectomy, right Jun 07, 2024   Hypertensive urgency 05/19/2024   Tobacco use 05/19/2024   Underweight 05/19/2024   Steroid-induced hyperglycemia 05/19/2024   Polycythemia secondary to smoking 05/19/2024   Elevated brain natriuretic peptide (BNP) level 05/19/2024   Aortic atherosclerosis 05/19/2024   Unintentional weight loss 05/19/2024   CAP (community acquired pneumonia)  05/19/2024   COPD with acute exacerbation (HCC) 05/19/2024   Mass of soft tissue of left upper extremity 01/22/2019   Smoker 01/22/2013      Significant Events: Admitted 05/19/2024    Subjective / Interval 24 hour History:   Patient seen virtually during a.m. medic visit to the home this morning.  She reports cough is improving, is non-productive.  Denies fever/chills.  Slept well overnight.  We discussed her CT scan showing lung nodule that is concerning for possible lung cancer and that I will reach out to oncology and pulmonology about next steps.  They did not have further questions during the visit.       Admission Labs: CMP notable for glucose 125 WBC 13.7 Hemoglobin 17.3 proBNP 1830  Admission Imaging Studies: Portable chest x-ray 05/19/2024 --Right basilar opacity suspicious for bronchopneumonia -- Chronic hyperinflation  Significant Labs: 12/28 -CMP notable for glucose 180, BUN 24 (on steroids) WBC trend 13.7 >> 16.3 >> 14.0 (on steroids) Hemoglobin A1c 5.8%  Strep pneumo urinary antigen negative Respiratory PCR panel negative  Significant Imaging Studies: CT chest abdomen pelvis Jun 07, 2024 -- Consolidative nodular airspace disease in the dependent right lower lobe with surrounding ground glass opacities consistent with infection -- Spiculated nodule in the left upper lobe measuring 1.7 x 1.2 cm, suspicious for primary bronchogenic malignancy -- Soft tissue density at the left hilum and narrowing of the left upper lobe bronchus poorly assessed in the absence of IV contrast suspicious for hilar adenopathy -- No evidence of metastatic disease in the abdomen or pelvis -- Chronic left UPJ obstruction -- Colonic diverticulosis without diverticulitis  Antibiotic Therapy: Anti-infectives (From admission, onward)    Start     Dose/Rate Route Frequency Ordered Stop   05/22/24 1000  azithromycin  (ZITHROMAX ) tablet 500 mg        500 mg Oral Daily 05/22/24 0739  05/24/24 0959   05/21/24 1000  cefTRIAXone  (ROCEPHIN ) 1 g in sodium chloride  0.9 % 100 mL IVPB        1 g 200 mL/hr over 30 Minutes Intravenous Every 24 hours June 07, 2024 1536 05/24/24 0959   06/07/2024 1200  cefTRIAXone  (ROCEPHIN ) 1 g in sodium chloride  0.9 % 100 mL IVPB  Status:  Discontinued        1 g 200 mL/hr over 30 Minutes Intravenous Every 24 hours 05/19/24 1201 06/07/24 1536   06-07-24 1100  azithromycin  (ZITHROMAX ) 500 mg in sodium chloride  0.9 % 250 mL IVPB  Status:  Discontinued        500 mg 250 mL/hr over 60 Minutes Intravenous Every 24 hours 05/19/24 1201 05/22/24 0739   05/19/24 1145  cefTRIAXone  (ROCEPHIN ) 1 g in sodium chloride  0.9 % 100 mL IVPB        1 g 200 mL/hr over 30 Minutes Intravenous  Once 05/19/24 1133 05/19/24 1216   05/19/24 1145  azithromycin  (ZITHROMAX ) tablet 500 mg  500 mg Oral  Once 05/19/24 1133 05/19/24 1146       Procedures: None  Consultants: None          Physical Exam:    05/22/2024    9:00 AM 05/22/2024    6:03 AM 05/21/2024    8:30 PM  Vitals with BMI  Weight 103 lbs 5 oz    BMI 17.19    Systolic 196  167  Diastolic 96  95  Pulse 92 63 87     Bedside physical exam was performed by RN listed above. Below exam findings are based on their in person physical exam findings and my observations during virtual encounter.  General exam: awake, alert, no acute distress HEENT: Voice clear, hearing grossly normal  Respiratory system: left upper rhonchi & wheez, right upper wheeze inspiration/expiration with some rhonchi, diminished in bilateral baess, non-productive but coarse sounding cough, normal respiratory effort at rest, on 2 L/min [place] O2. Cardiovascular system: normal S1/S2, RRR, radial pulses strong symmetric Gastrointestinal system: soft, NT, ND,  +bowel sounds. Central nervous system: A&O x3. no gross focal neurologic deficits, normal speech Psychiatry: normal mood, congruent affect, judgement and insight appear  normal    Data Reviewed:  As reviewed above   Family Communication: Son was present in the home during virtual encounter this morning  Disposition: Status is: Inpatient Remains inpatient appropriate because: Remains on IV antibiotics pending further clinical improvement, still requiring 2 L/min nasal cannula oxygen without baseline requirement for O2    Planned Discharge Destination: Home    Time spent: 45 minutes  Author: Burnard DELENA Cunning, DO [facility]  01/13/2024 7:00 AM  For on call review [email or web address].   "

## 2024-05-22 NOTE — Progress Notes (Signed)
 On virtually with medic Patient reports sleeping well, medic reports wheezes right side. BP 206/109 current health manual taken 198/98, O2 93%, HR 84. Provider notified.. BP medications given by medics dual sign for losartan  25mg /amlodipine  10mg  only.  .    On with patient, provider, field team, virtual RN discussing increased dose of amlodipine  to tx  BP and also steroid use. Denies feeling feverish. Provider discussed mass found on imaging and F/U with oncology. Needs oncologist to F/U patient agreed.

## 2024-05-23 DIAGNOSIS — R7989 Other specified abnormal findings of blood chemistry: Secondary | ICD-10-CM

## 2024-05-23 LAB — BASIC METABOLIC PANEL WITH GFR
Anion gap: 12 (ref 5–15)
BUN: 22 mg/dL (ref 8–23)
CO2: 30 mmol/L (ref 22–32)
Calcium: 8.9 mg/dL (ref 8.9–10.3)
Chloride: 96 mmol/L — ABNORMAL LOW (ref 98–111)
Creatinine, Ser: 0.7 mg/dL (ref 0.44–1.00)
GFR, Estimated: >60 mL/min
Glucose, Bld: 288 mg/dL — ABNORMAL HIGH (ref 70–99)
Potassium: 4.2 mmol/L (ref 3.5–5.1)
Sodium: 138 mmol/L (ref 135–145)

## 2024-05-23 LAB — CBC
HCT: 53.5 % — ABNORMAL HIGH (ref 36.0–46.0)
Hemoglobin: 17.2 g/dL — ABNORMAL HIGH (ref 12.0–15.0)
MCH: 30.6 pg (ref 26.0–34.0)
MCHC: 32.1 g/dL (ref 30.0–36.0)
MCV: 95.2 fL (ref 80.0–100.0)
Platelets: 386 K/uL (ref 150–400)
RBC: 5.62 MIL/uL — ABNORMAL HIGH (ref 3.87–5.11)
RDW: 12.9 % (ref 11.5–15.5)
WBC: 9.2 K/uL (ref 4.0–10.5)
nRBC: 0 % (ref 0.0–0.2)

## 2024-05-23 MED ORDER — PREDNISONE 20 MG PO TABS
40.0000 mg | ORAL_TABLET | Freq: Every day | ORAL | Status: DC
  Administered 2024-05-24: 40 mg via ORAL
  Filled 2024-05-23 (×2): qty 2

## 2024-05-23 MED ORDER — HYDRALAZINE HCL 50 MG PO TABS
50.0000 mg | ORAL_TABLET | Freq: Two times a day (BID) | ORAL | Status: DC
  Administered 2024-05-23 – 2024-05-24 (×2): 50 mg via ORAL
  Filled 2024-05-23 (×4): qty 1

## 2024-05-23 MED ORDER — CARVEDILOL 6.25 MG PO TABS
6.2500 mg | ORAL_TABLET | Freq: Two times a day (BID) | ORAL | Status: DC | PRN
  Filled 2024-05-23 (×3): qty 1

## 2024-05-23 NOTE — Progress Notes (Signed)
 Spoke with pt via phone. She  had not answered earlier call. Pt states she was sleeping . No complaints,states she is in no distress. Oxygen sats 90-92.

## 2024-05-23 NOTE — Progress Notes (Signed)
 Completed virtual rounds with MD,paramedic at patient bedside. POC reviewed and discussed ,patient voices understanding and agreement. Pt reminded to call RN for any needs, RN and MD available at all times. Pt voices understanding. Pt aware of next planned visit and next call from RN.

## 2024-05-23 NOTE — Progress Notes (Addendum)
 "  Hospital at Home Daily Progress Note   Patient: Sharon Wilkinson  MRN: [id number]  DOB: [birth year]  DOA: 05/19/2024  DOS: the patient was seen and examined on @TODAY @    Patient identified themself as Sharon Wilkinson  DOB [birth year]  Medic Consuelo Kerns present in the home during encounter and performed the assessment and physical exam. Patient was seen today via video conference; my physical location [place]   Brief hospital course:  Sharon Wilkinson is a 83 y.o. female with medical history significant of osteoarthritis of multiple sites, right kidney cancer, right nephrectomy, left shoulder soft tissue mass, active smoker, COPD who was sent from the urgent care center to the emergency department due to nonproductive cough, dyspnea and fatigue for the past 5 days.  However, she was having severe hypertension and was referred to the emergency department.  She has also been having chronic diarrhea, but he has gotten worse in the last 5 days.  She denied fever, chills, rhinorrhea, sore throat, wheezing or hemoptysis.  No chest pain, palpitations, diaphoresis, PND, orthopnea or pitting edema of the lower extremities.  No abdominal pain, nausea, emesis, constipation, melena or hematochezia.  No flank pain, dysuria, frequency or hematuria.  No polyuria, polydipsia, polyphagia or blurred vision.    Lab work: CBC showed a white count of 13.7, hemoglobin 17.3 g/dL and platelets 728.  proBNP was 1830.0 pg/mL.  CMP was unremarkable with the assassin of a nonfasting glucose of 125 mg/dL.   Imaging: Portable 1 view chest radiograph showing normal heart size, right basilar opacity suspicious for bronchopneumonia.  Chronic hyperinflation.   ED course: Initial vital signs were temperature 98.7 F, pulse 72, respiration 18, BP 190/112 mmHg and O2 sat 91% on room air.  The patient subsequently had hypoxia going down to 84% on room air.  She was placed on nasal cannula oxygen and her most recent O2 sat was 93% on [place] at  2 LPM.  She received 125 mg of Solu-Medrol  and a DuoNeb.  05/19/2024 - admitted to hospital. Started on IV Rocephin , Zithromax , high dose IV steroid, neb treatments etc.  05/20/2024 - transferred to [facility] program Day 2 antibiotics & IV steroids  05/21/2024 - remains on 2 L/min O2 with sats 90-93%. SOB & cough improving. Day 3 antibiotics & steroids (PO prednisone )  05/22/2024 - remains on 2 L/min O2 with sats low 90's.  Qualifies for home O2 with today's ambulatory O2 sat dropping  to 87% on room air.  Day 4 antibiotics & steroids  05/23/2024 - remains on 2 l/min with spO2 90-95%.  Reports more coughing, worse with ambulation, otherwise feeling better. Afternoon visit spO2 88% on 2 L -- increased to 3 L/min.  Considered chest x-ray but pt declines for now. Day 5/5 antibiotics.  Start tapering prednisone  tomorrow.       Assessment and Plan:  * Acute respiratory failure with hypoxia (HCC) COPD Exacerbation  PNA   Decompensated resp status now requiring 2-3L [place] to keep O2 sats >92%  Noted R basilar PNA on CXR  + coughing, wheezing and increased sputum production in setting of extensive tobacco history and ? Recent diagnosis of obstructive lung disease  12/30 - day 5/5 antibiotics. Pred 50 --Complete course of Rocephin  and Zithromax  today --Start prednisone  taper tomorrow 50 >> 40 mg, plan to wean by 10 mg daily vs every other --Initial high dose IV steroid  --Steroid may be contributing to her BP's --Prn duonebs  --Cont supplemental O2  to keep sats > 90% --IS & Flutter valve  --Home O2 qualification done and home o2 has been ordered --Pulmonology outpatient referral has been placed for COPD/emphysema and lung nodule follow up  12/30 - O2 turned up this afternoon 2>>3 L/min due to spO02 88% on 2L.  Pt declined chest x-ray for now.  Medic exam with wheezing and bibasilar rhonchi not previously heard (?now audible with improved air movement vs. Edema vs persistent PNA) --Check a repeat  proBNP for now --CXR tomorrow if pt agreeable   Chronic diarrhea Reports chronic intermittent diarrhea without frank bloody or bilious morphology  Now resolved per patient  No decreased appetite --GI consulted- appreciate input  --CT A&P obtained (without contrast in setting of solitary kidney s/p nephrectomy)  --Pt declined stool studies per report  --Dietary consulted  --Ensures for protein supplementation  --GI rec to consider EGD and colonoscopy depending on respiratory / clinical status for anesthesia --GI recommended follow up in 2-3 months   Nodule of upper lobe of left lung CT chest/abd/pelvis obtained for evaluation of unintentional weight loss shows at 1.7 x 1.2 cm spiculated nodule in the LUL, highly suspicious for bronchogenic malignancy. --Referral to Pulmonology has been sent.  --Discussed case with oncology - recommended send to pulmonary for potential biopsy vs surveillance, and they will refer to Oncology if needed if malignancy is confirmed.   Given weight loss and spiculated appearance, concerning for malignancy. --Finding has been discussed in detail with patient and her son  Protein-calorie malnutrition, severe chronic illness (COPD, diarrhea x 4 months) as evidenced by energy intake < or equal to 75% for > or equal to 1 month, percent weight loss (20% weight loss within 6 months).  Body mass index is 17.19 kg/m. --Appreciate dietitian recommendations --Ensures, MVI --Handout added to d/c instructions & RD discussed with patient   History of nephrectomy, right Noted prior history of RCC s/p R nephrectomy  12/30 - Cr 0.58 today  --Will monitor  --Try to avoid nephrotoxins in setting of solitary functioning kidney    Chronic heart failure with preserved ejection fraction (HFpEF) (HCC) Noted prior history of elevated BNP  ProBNP 1800 12/26  2D ECHO 05/19/24 w/ EF 60-65% and grade 1 diastolic dysfunction  Appears euvolemic  --Monitor volume status   --Outpatient cardiology follow up.     COPD with acute exacerbation (HCC) Mgmt as above  CAP (community acquired pneumonia) .  Unintentional weight loss Noted roughly 35 lb weight loss over the last several months per patient (nondescript)  Prealbumin low at 7  Underweight - Body mass index is 16.62 kg/m.  12/28 - results of CT chest/abd/pelvis yesterday shows a suspicious left upper lobe lung nodule concerning for malignancy, some hilar adenopathy (could also be seen in PNA), there was not evidence of metastatic disease in the abdomen/pelvis.   Aortic atherosclerosis .  Elevated brain natriuretic peptide (BNP) level proBNP 1830 on admission Clinically not volume overloaded. 12/30 - O2 had to be increased 2 >> 3 L this afternoon with borderline O2 sats.  She does not appears volume overload peripherally.  Pt declined to go get a CXR this afternoon.   --Will check a repeat proBNP for trend  Polycythemia secondary to smoking 12/29- Hgb 16.0 today  12/30 - labs pending Looks to be near baseline  --Follow CBC  Steroid-induced hyperglycemia Blood sugar in 180s  12/29 - improved to 109 on this AM's labs Suspect element of steroid induced hyperglycemia  --Check CBG's BID & glucose  on labs --A1C is 5.8% --Anticipate decreased w/ downtrending steroid use.  --has not required coverage --Consider low dose lantus vs. 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 PPD smoker  Cessation was discussed at length on admission - especially in setting of hospital at home program agreement and supplemental O2 use  Pt in full agreement with tobacco abstinence  Discussed risks initially with patient and O2 use- I know....BOOM Son also sequestering cigarettes  --Nicotine  patch in place  12/28 - pt reports doing well, not craving nicotine ,  expresses desire to stop smoking  Hypertensive urgency Iniital BPs 160s-200s/90s-100s  Has had no consistent outpatient follow up  Initially improved w/ prn IV hydralazine  12/30 - BP's still very elevated, SBP > 200 this morning despite escalation of regimen past days --Now on Norvasc  10 mg daily, losartan  50 mg daily --Add hydralazine  50 mg BID --Change PRN from metop >> Coreg  for better BP-lowering --Will monitor closely  --Titrate regimen --Steroids likely contributing to BP elevation --Pt does not follow with physicians, suspect undiagnosed chronic HTN --Plan to set up for close PCP follow up & establish at IM resident clinic     Patient Active Problem List   Diagnosis Date Noted   Acute respiratory failure with hypoxia (HCC) 05/19/2024    Priority: 1.   Chronic diarrhea 05/19/2024    Priority: 2.   Protein-calorie malnutrition, severe 05/22/2024   Nodule of upper lobe of left lung 05/22/2024   Chronic heart failure with preserved ejection fraction (HFpEF) (HCC) Jun 17, 2024   History of nephrectomy, right Jun 17, 2024   Hypertensive urgency 05/19/2024   Tobacco use 05/19/2024   Underweight 05/19/2024   Steroid-induced hyperglycemia 05/19/2024   Polycythemia secondary to smoking 05/19/2024   Elevated brain natriuretic peptide (BNP) level 05/19/2024   Aortic atherosclerosis 05/19/2024   Unintentional weight loss 05/19/2024   CAP (community acquired pneumonia) 05/19/2024   COPD with acute exacerbation (HCC) 05/19/2024   Mass of soft tissue of left upper extremity 01/22/2019   Smoker 01/22/2013      Significant Events: Admitted 05/19/2024    Subjective / Interval 24 hour History:   Patient seen virtually during Medic visit this afternoon.  She reports coughing more today, especially when up walking around.  Denies chest pain or tightness.  Doesn't feel short of breath.  She denies phlegm production.  No fevers but often chilled, stating she is cold-natured.        Admission Labs: CMP notable for glucose 125 WBC 13.7 Hemoglobin 17.3 proBNP 1830  Admission Imaging Studies: Portable chest x-ray 05/19/2024 --Right basilar opacity suspicious for bronchopneumonia -- Chronic hyperinflation  Significant Labs: 12/28 -CMP notable for glucose 180, BUN 24 (on steroids) WBC trend 13.7 >> 16.3 >> 14.0 >> 13.1 (on steroids) Hemoglobin A1c 5.8%  Strep pneumo urinary antigen negative Respiratory PCR panel negative Glucose on labs 180 >> 109   Significant Imaging Studies: CT chest abdomen pelvis 06-17-24 -- Consolidative nodular airspace disease in the dependent right lower lobe with surrounding ground glass opacities consistent with infection -- Spiculated nodule in the left upper lobe measuring 1.7 x 1.2 cm, suspicious for primary bronchogenic malignancy -- Soft tissue density at the left hilum and narrowing of the left upper lobe bronchus poorly assessed in the absence of IV contrast suspicious for hilar adenopathy -- No evidence of metastatic disease in the  abdomen or pelvis -- Chronic left UPJ obstruction -- Colonic diverticulosis without diverticulitis  Antibiotic Therapy: Anti-infectives (From admission, onward)    Start     Dose/Rate Route Frequency Ordered Stop   05/22/24 1000  azithromycin  (ZITHROMAX ) tablet 500 mg        500 mg Oral Daily 05/22/24 0739 05/23/24 1107   05/21/24 1000  cefTRIAXone  (ROCEPHIN ) 1 g in sodium chloride  0.9 % 100 mL IVPB        1 g 200 mL/hr over 30 Minutes Intravenous Every 24 hours 05/20/24 1536 05/23/24 1343   05/20/24 1200  cefTRIAXone  (ROCEPHIN ) 1 g in sodium chloride  0.9 % 100 mL IVPB  Status:  Discontinued        1 g 200 mL/hr over 30 Minutes Intravenous Every 24 hours 05/19/24 1201 05/20/24 1536   05/20/24 1100  azithromycin  (ZITHROMAX ) 500 mg in sodium chloride  0.9 % 250 mL IVPB  Status:  Discontinued        500 mg 250 mL/hr over 60 Minutes Intravenous Every 24 hours 05/19/24 1201 05/22/24 0739    05/19/24 1145  cefTRIAXone  (ROCEPHIN ) 1 g in sodium chloride  0.9 % 100 mL IVPB        1 g 200 mL/hr over 30 Minutes Intravenous  Once 05/19/24 1133 05/19/24 1216   05/19/24 1145  azithromycin  (ZITHROMAX ) tablet 500 mg        500 mg Oral  Once 05/19/24 1133 05/19/24 1146       Procedures: None  Consultants: None          Physical Exam:    05/23/2024    1:16 PM 05/23/2024   11:00 AM 05/22/2024   10:05 PM  Vitals with BMI  Systolic 168 206 812  Diastolic 100 120 98  Pulse 74 90 76     Bedside physical exam was performed by RN listed above. Below exam findings are based on their in person physical exam findings and my observations during virtual encounter.  General exam: awake, alert, no acute distress HEENT: Voice clear, hearing grossly normal  Respiratory system: lungs with diffuse wheezes, bibasilar rhonchi (previously bases diminished), on 3 l/min O2, normal respiratory effort at rest, no accessory muscle use or conversational dyspnea noted. Cardiovascular system: normal S1/S2, RRR, radial pulses strong symmetric Gastrointestinal system: soft, NT, ND,  +bowel sounds. Central nervous system: A&O x3. no gross focal neurologic deficits, normal speech Psychiatry: normal mood, congruent affect, judgement and insight appear normal    Data Reviewed:  As reviewed above   Family Communication:None present to my knowledge during virtual encounter this morning  Disposition: Status is: Inpatient Remains inpatient appropriate because: BP's remain severely elevated, O2 requirement increased this afternoon, needs further monitoring improvement and BP improvement prior to d/c.   Estimated d/c 12/31 (vs 1/1 pending respiratory status)    Planned Discharge Destination: Home    Time spent: 55 minutes  Author: Burnard DELENA Cunning, DO [facility]  01/13/2024 7:00 AM  For on call review [email or web address].   "

## 2024-05-23 NOTE — Plan of Care (Signed)
" °  Problem: Education: Goal: Knowledge of General Education information will improve Description: Including pain rating scale, medication(s)/side effects and non-pharmacologic comfort measures Outcome: Progressing   Problem: Health Behavior/Discharge Planning: Goal: Ability to manage health-related needs will improve Outcome: Progressing   Problem: Clinical Measurements: Goal: Ability to maintain clinical measurements within normal limits will improve Outcome: Not Progressing Goal: Will remain free from infection Outcome: Progressing Goal: Diagnostic test results will improve Outcome: Progressing Goal: Respiratory complications will improve Outcome: Not Progressing Goal: Cardiovascular complication will be avoided Outcome: Progressing   Problem: Activity: Goal: Risk for activity intolerance will decrease Outcome: Progressing   Problem: Nutrition: Goal: Adequate nutrition will be maintained Outcome: Progressing   Problem: Coping: Goal: Level of anxiety will decrease Outcome: Completed/Met   Problem: Elimination: Goal: Will not experience complications related to bowel motility Outcome: Completed/Met   Problem: Pain Managment: Goal: General experience of comfort will improve and/or be controlled Outcome: Completed/Met   Problem: Safety: Goal: Ability to remain free from injury will improve Outcome: Progressing   Problem: Skin Integrity: Goal: Risk for impaired skin integrity will decrease Outcome: Progressing   Problem: Education: Goal: Knowledge of disease or condition will improve Outcome: Progressing Goal: Knowledge of the prescribed therapeutic regimen will improve Outcome: Progressing Goal: Individualized Educational Video(s) Outcome: Progressing   Problem: Activity: Goal: Ability to tolerate increased activity will improve Outcome: Progressing Goal: Will verbalize the importance of balancing activity with adequate rest periods Outcome: Progressing    Problem: Respiratory: Goal: Ability to maintain a clear airway will improve Outcome: Progressing Goal: Levels of oxygenation will improve Outcome: Not Progressing Goal: Ability to maintain adequate ventilation will improve Outcome: Progressing   Problem: Activity: Goal: Ability to tolerate increased activity will improve Outcome: Completed/Met   Problem: Clinical Measurements: Goal: Ability to maintain a body temperature in the normal range will improve Outcome: Completed/Met   "

## 2024-05-23 NOTE — Progress Notes (Signed)
 H@H  medic out to see patient for first visit of the day. On arrival, I was met by the patients niece at the door who welcomed me in. See flowsheet for assessment. Pt was titrated up to 3L of oxygen via nasal cannula due to initial sats being 88 percent and then 90 percent after a few deep breaths. RN and provider were made aware. Pt states that she was feeling pretty crummy today and not feeling like she did when she first got home from the hospital. Concerns were brought up with the provider today who spoke with the patient via video call. Pt received last dose of IV antibiotics today. Pts home oxygen supply was dropped off by [employer] supplier today during visit. All questions and concerns were addressed at this time. No other tasks were needed for this visit. Pt was left in care of herself and family. H@H  visit complete.

## 2024-05-23 NOTE — TOC Progression Note (Signed)
 Transition of Care [facility]) - Progression Note    Patient Details  Name: Sharon Wilkinson MRN: [id number] Date of Birth: 29-Jul-1940  Transition of Care [facility]) CM/SW Contact  Debarah Saunas, RN Phone Number: 05/23/2024, 9:32 AM  Clinical Narrative:    RNCM consulted regarding home DME set-up and follow-up appt with [facility] Medicine [facility]   Expected Discharge Plan: Home w Home Health Services Barriers to Discharge: Continued Medical Work up (increased B/P; med adjustments)               Expected Discharge Plan and Services   Discharge Planning Services: CM Consult   Living arrangements for the past 2 months: Single Family Home                 DME Arranged: [place] rolling DME Agency: [employer]    Pt qualifies for DME [employer]) oxygen.  DME ordered through [employer].  Jermaine, liaison of [employer] notified to deliver DME to pt home.  DME agency information placed on After Visit Summary (AVS) for pt to refer to with any DME questions after discharge.  RNCM obtained appointment on May 31, 2024 @1 :7  with [name] (Internal Medicine) and placed on After Visit Summary paperwork.  No further case management needs communicated at this time. Shonia Skilling J. Debarah, BSN,  RN, [facility] [phone]                     Social Drivers of Health (SDOH) Interventions SDOH Screenings   Food Insecurity: No Food Insecurity (05/19/2024)  Housing: Low Risk (05/19/2024)  Transportation Needs: No Transportation Needs (05/19/2024)  Utilities: Not At Risk (05/19/2024)  Social Connections: Moderately Isolated (05/19/2024)  Tobacco Use: High Risk (05/19/2024)    Readmission Risk Interventions     No data to display

## 2024-05-23 NOTE — Progress Notes (Signed)
 Phone call completed with patient. Introduced myself as the CHARITY FUNDRAISER for the night and verbalized that I am available via tablet or phone call if needed overnight. Patient denies any needs at this time. Patient waiting on medic arrival to assist with evening medications. Plan made for patient to call RN when she is ready to take her sleeping medication.

## 2024-05-23 NOTE — Progress Notes (Signed)
 Spoke with patient via ipad. Pt administered her am meds with my supervision. BP in the 200's systolic via current health automatic BP. Prn metoprolol  taken as well. Pt states she doesn't feel good today, more coughing and more fatigue.  Pt demonstrated IS for me and was able to reach 500.  Instructed to use this every hour 5-10 x as well as flutter. She states she had been using flutter but not IS.   Niece at pt's side and assured she would remind pt. Updated on timing of paramedic visit

## 2024-05-24 LAB — PRO BRAIN NATRIURETIC PEPTIDE: Pro Brain Natriuretic Peptide: 1397 pg/mL — ABNORMAL HIGH

## 2024-05-24 MED ORDER — ASPIRIN 81 MG PO TBEC: 81.0000 mg | DELAYED_RELEASE_TABLET | ORAL | 1 refills | Status: AC

## 2024-05-24 MED ORDER — HYDRALAZINE HCL 50 MG PO TABS: 50.0000 mg | ORAL_TABLET | Freq: Two times a day (BID) | ORAL | 1 refills | Status: DC

## 2024-05-24 MED ORDER — AMLODIPINE BESYLATE 10 MG PO TABS: 10.0000 mg | ORAL_TABLET | Freq: Every day | ORAL | 1 refills | Status: DC

## 2024-05-24 MED ORDER — LOSARTAN POTASSIUM 50 MG PO TABS: 50.0000 mg | ORAL_TABLET | Freq: Every day | ORAL | 1 refills | Status: AC

## 2024-05-24 MED ORDER — CARVEDILOL 6.25 MG PO TABS: 6.2500 mg | ORAL_TABLET | Freq: Two times a day (BID) | ORAL | 0 refills | Status: AC | PRN

## 2024-05-24 MED ORDER — IPRATROPIUM-ALBUTEROL 0.5-2.5 (3) MG/3ML IN SOLN: 3.0000 mL | RESPIRATORY_TRACT | 1 refills | Status: AC | PRN

## 2024-05-24 MED ORDER — PREDNISONE 20 MG PO TABS: 20.0000 mg | ORAL_TABLET | Freq: Every day | ORAL | 0 refills | Status: DC

## 2024-05-24 MED ORDER — ONDANSETRON HCL 4 MG PO TABS: 4.0000 mg | ORAL_TABLET | Freq: Four times a day (QID) | ORAL | 0 refills | Status: AC | PRN

## 2024-05-24 NOTE — TOC CM/SW Note (Addendum)
 Confirmed with [employer] rep at [phone] that all DME has been delivered.   Merilee Batty, MSN, RN Case Management [phone]   1630: Per RN, neb machine has not yet been delivered. Reached out to [employer] rep at [phone]. Machine is on it's way, it was on an alternate truck, but will be there this evening.

## 2024-05-24 NOTE — Progress Notes (Signed)
 Current health showing an spo2 reading of 84%. Called patient and had her son check 02 reading with home pulse ox and it shows 96%. Armband in place and nasal cannula in place. Patient was sleeping and in no distress.

## 2024-05-24 NOTE — TOC Progression Note (Signed)
 Transition of Care [facility]) - Progression Note    Patient Details  Name: Sharon Wilkinson MRN: [id number] Date of Birth: [birth year]  Transition of Care [facility]) CM/SW Contact  Debarah Saunas, RN Phone Number: 05/24/2024, 8:47 AM  Clinical Narrative:    RNCM consulted regarding DME Home Nebulizer.  Discussed in rounds; RN states pt has nebulizer in the home.  [facility] team will confirm during home visit today.   Expected Discharge Plan: Home w Home Health Services Barriers to Discharge: Continued Medical Work up (increased B/P; med adjustments)               Expected Discharge Plan and Services   Discharge Planning Services: CM Consult   Living arrangements for the past 2 months: Single Family Home Expected Discharge Date: 05/24/24               DME Arranged: V[place] rolling DME Agency: [employer]                   Social Drivers of Health (SDOH) Interventions SDOH Screenings   Food Insecurity: No Food Insecurity (05/19/2024)  Housing: Low Risk (05/19/2024)  Transportation Needs: No Transportation Needs (05/19/2024)  Utilities: Not At Risk (05/19/2024)  Social Connections: Moderately Isolated (05/19/2024)  Tobacco Use: High Risk (05/19/2024)    Readmission Risk Interventions     No data to display

## 2024-05-24 NOTE — Progress Notes (Signed)
 IMM signed on 05/24/24 @ 0915

## 2024-05-24 NOTE — Plan of Care (Signed)
" °  Problem: Education: Goal: Knowledge of General Education information will improve Description: Including pain rating scale, medication(s)/side effects and non-pharmacologic comfort measures Outcome: Progressing   Problem: Health Behavior/Discharge Planning: Goal: Ability to manage health-related needs will improve Outcome: Progressing   Problem: Clinical Measurements: Goal: Ability to maintain clinical measurements within normal limits will improve Outcome: Progressing Goal: Respiratory complications will improve Outcome: Progressing Goal: Cardiovascular complication will be avoided Outcome: Progressing   Problem: Nutrition: Goal: Adequate nutrition will be maintained Outcome: Progressing   Problem: Safety: Goal: Ability to remain free from injury will improve Outcome: Progressing   Problem: Education: Goal: Knowledge of disease or condition will improve Outcome: Progressing Goal: Knowledge of the prescribed therapeutic regimen will improve Outcome: Progressing   Problem: Respiratory: Goal: Ability to maintain a clear airway will improve Outcome: Progressing Goal: Levels of oxygenation will improve Outcome: Progressing Goal: Ability to maintain adequate ventilation will improve Outcome: Progressing   Problem: Respiratory: Goal: Ability to maintain adequate ventilation will improve Outcome: Progressing Goal: Ability to maintain a clear airway will improve Outcome: Progressing   "

## 2024-05-24 NOTE — Progress Notes (Signed)
 The pt was seen today for her hospital at home visit. The pt was alert and oriented x4. Her skin was warm ,dry and appropriate in color. She had a strong radial pulse and had no labored breathing. The pt stated that she has had a good day overall. Vitals were taken and are as noted. The pt's blood pressure was taken both using a  manual bp cuff and a automatic cuff due to elevated blood pressures the past few days. It was noted that there was a difference in her readings.  Lung sounds were ausculted and were noted to be diminished in all fields. Pt denied any shortness of breath at this time.  The pt was asked if she had any headaches.dizziness or blurred vision with her pressure still be slightly elevated and she denied same. The pt's blood work was collected and she was assisted with her nighttime medications. The pt was told to call if she needed any help and she agreed to same.

## 2024-05-24 NOTE — TOC Transition Note (Signed)
 Transition of Care [facility]) - Discharge Note   Patient Details  Name: Sharon Wilkinson MRN: [id number] Date of Birth: [birth year]  Transition of Care [facility] CM/SW Contact:  Debarah Saunas, RN Phone Number: 05/24/2024, 9:51 AM   Clinical Narrative:    Pt to d/c today.  RNCM confirmed DME ordered and received.   Final next level of care: Home w Home Health Services Barriers to Discharge: Continued Medical Work up (increased B/P; med adjustments)   Patient Goals and CMS Choice Patient states their goals for this hospitalization and ongoing recovery are:: remain home with home health, DME oxygen, rolling walker and nebulizer CMS Medicare.gov Compare Post Acute Care list provided to:: Patient Choice offered to / list presented to : Patient      Pt chose [facility] to render RN and PT services. Artayvia, liaison of [facility] notified. Patient made aware that [facility] will be in contact in 24-48 hours. HH agency information placed on After Visit Summary (AVS) for pt to contact with any home health questions after discharge. No DME needs identified at this time. Maryalyce Sanjuan J. Debarah, BSN, RN, [employer] [phone]                        Discharge Plan and Services Additional resources added to the After Visit Summary for     Discharge Planning Services: CM Consult            DME Arranged: Nebulizer machine, Oxygen DME Agency: [employer] Date DME Agency Contacted: 05/24/24 Time DME Agency Contacted: [phone] Representative spoke with at DME Agency: [place] HH Arranged: RN, PT [facility] Agency: [facility] ([facility] Date HH Agency Contacted: 05/24/24 Time HH Agency Contacted: [phone] Representative spoke with at [facility] Agency: [place]  Social Drivers of Health (SDOH) Interventions SDOH Screenings   Food Insecurity: No Food Insecurity (05/19/2024)  Housing: Low Risk (05/19/2024)  Transportation Needs: No Transportation Needs (05/19/2024)  Utilities: Not At Risk (05/19/2024)  Social  Connections: Moderately Isolated (05/19/2024)  Tobacco Use: High Risk (05/19/2024)     Readmission Risk Interventions    05/24/2024    9:51 AM  Readmission Risk Prevention Plan  Post Dischage Appt Complete  Medication Screening Complete  Transportation Screening Complete

## 2024-05-24 NOTE — Progress Notes (Signed)
 Communicated with patient via video tablet. Patient appears comfortable on the couch. AVS paperwork given to the patient. Discharge instructions discussed with patient and the patient's sister, Merlynn. Patients question answered to satisfaction. Patient agreeable with discharge plan.

## 2024-05-24 NOTE — Discharge Summary (Signed)
 " Physician Discharge Summary   Patient: Sharon Wilkinson MRN: [id number] DOB: Mar 16, 1941  Admit date:     05/19/2024  Discharge date: 05/24/2024  Discharge Physician: Dr. Sherre   PCP: Patient, No Pcp Per   Recommendations at discharge:   Take prednisone  taper to complete course, last day is 05/28/24. You have new blood pressure medications that have been prescribed.  Follow-up with your primary care provider on January 7th with Dr. [name].   Discharge Diagnoses:  Principal Problem:   Acute respiratory failure with hypoxia (HCC) Active Problems:   Chronic diarrhea   Hypertensive urgency   Tobacco use   Underweight   Steroid-induced hyperglycemia   Polycythemia secondary to smoking   Elevated brain natriuretic peptide (BNP) level   Aortic atherosclerosis   Unintentional weight loss   CAP (community acquired pneumonia)   COPD with acute exacerbation (HCC)   Chronic heart failure with preserved ejection fraction (HFpEF) (HCC)   History of nephrectomy, right   Protein-calorie malnutrition, severe   Nodule of upper lobe of left lung  Resolved Problems:   * No resolved hospital problems. *  Hospital Course:  Sharon Wilkinson is a 83 y.o. female with medical history significant of osteoarthritis of multiple sites, right kidney cancer, right nephrectomy, left shoulder soft tissue mass, active smoker, COPD who was sent from the urgent care center to the emergency department due to nonproductive cough, dyspnea and fatigue for the past 5 days.  However, she was having severe hypertension and was referred to the emergency department.  She has also been having chronic diarrhea, but he has gotten worse in the last 5 days.  She denied fever, chills, rhinorrhea, sore throat, wheezing or hemoptysis.  No chest pain, palpitations, diaphoresis, PND, orthopnea or pitting edema of the lower extremities.  No abdominal pain, nausea, emesis, constipation, melena or hematochezia.  No flank pain,  dysuria, frequency or hematuria.  No polyuria, polydipsia, polyphagia or blurred vision.    Lab work: CBC showed a white count of 13.7, hemoglobin 17.3 g/dL and platelets 728.  proBNP was 1830.0 pg/mL.  CMP was unremarkable with the assassin of a nonfasting glucose of 125 mg/dL.   Imaging: Portable 1 view chest radiograph showing normal heart size, right basilar opacity suspicious for bronchopneumonia.  Chronic hyperinflation.   ED course: Initial vital signs were temperature 98.7 F, pulse 72, respiration 18, BP 190/112 mmHg and O2 sat 91% on room air.  The patient subsequently had hypoxia going down to 84% on room air.  She was placed on nasal cannula oxygen and her most recent O2 sat was 93% on [place] at 2 LPM.  She received 125 mg of Solu-Medrol  and a DuoNeb.  05/19/2024 - admitted to hospital. Started on IV Rocephin , Zithromax , high dose IV steroid, neb treatments etc.  05/20/2024 - transferred to [facility] program Day 2 antibiotics & IV steroids  05/21/2024 - remains on 2 L/min O2 with sats 90-93%. SOB & cough improving. Day 3 antibiotics & steroids (PO prednisone )  05/22/2024 - remains on 2 L/min O2 with sats low 90's.  Qualifies for home O2 with today's ambulatory O2 sat dropping  to 87% on room air.  Day 4 antibiotics & steroids  05/23/2024 - remains on 2 l/min with spO2 90-95%.  Reports more coughing, worse with ambulation, otherwise feeling better. Afternoon visit spO2 88% on 2 L -- increased to 3 L/min.  Considered chest x-ray but pt declines for now. Day 5/5 antibiotics.  Start tapering prednisone  tomorrow.  12/31: I assumed care of patient. Patient discharged home with antihypertensive medications. Repeat BP after blood pressure medications were 160/82. Patient discharged from [facility] and extensive discussion regarding follow-up with PCP was advised.  Assessment and Plan:  * Acute respiratory failure with hypoxia (HCC) COPD Exacerbation  PNA  Decompensated resp status now requiring 2-3L  [place] to keep O2 sats >92%  Noted R basilar PNA on CXR  + coughing, wheezing and increased sputum production in setting of extensive tobacco history and ? Recent diagnosis of obstructive lung disease  12/30 - day 5/5 antibiotics. Pred 50 Complete course of Rocephin  and Zithromax  today Start prednisone  taper tomorrow 50 >> 40 mg, plan to wean by 10 mg daily vs every other Initial high dose IV steroid  Steroid may be contributing to her BP's --Prn duonebs  --Cont supplemental O2 to keep sats > 90% --IS & Flutter valve  --Home O2 qualification done and home o2 has been ordered --Pulmonology outpatient referral has been placed for COPD/emphysema and lung nodule follow up  12/30 - O2 turned up this afternoon 2>>3 L/min due to spO02 88% on 2L.  Pt declined chest x-ray for now.  Medic exam with wheezing and bibasilar rhonchi not previously heard (?now audible with improved air movement vs. Edema vs persistent PNA) --Check a repeat proBNP for now --CXR tomorrow if pt agreeable  12/31: Tapering dose of prednisone  prescribed on discharge.  Chronic diarrhea Reports chronic intermittent diarrhea without frank bloody or bilious morphology  Now resolved per patient  No decreased appetite --GI consulted- appreciate input  --CT A&P obtained (without contrast in setting of solitary kidney s/p nephrectomy)  --Pt declined stool studies per report  --Dietary consulted  --Ensures for protein supplementation  --GI rec to consider EGD and colonoscopy depending on respiratory / clinical status for anesthesia --GI recommended follow up in 2-3 months   Nodule of upper lobe of left lung CT chest/abd/pelvis obtained for evaluation of unintentional weight loss shows at 1.7 x 1.2 cm spiculated nodule in the LUL, highly suspicious for bronchogenic malignancy. --Referral to Pulmonology has been sent.  --Discussed case with oncology - recommended send to pulmonary for potential biopsy vs surveillance, and they will  refer to Oncology if needed if malignancy is confirmed.   Given weight loss and spiculated appearance, concerning for malignancy. --Finding has been discussed in detail with patient and her son  Protein-calorie malnutrition, severe chronic illness (COPD, diarrhea x 4 months) as evidenced by energy intake < or equal to 75% for > or equal to 1 month, percent weight loss (20% weight loss within 6 months).  Body mass index is 17.19 kg/m. --Appreciate dietitian recommendations --Ensures, MVI --Handout added to d/c instructions & RD discussed with patient  History of nephrectomy, right Noted prior history of RCC s/p R nephrectomy  12/30 - Cr 0.58 today  --Will monitor  Avoid nephrotoxins in setting of solitary functioning kidney   Chronic heart failure with preserved ejection fraction (HFpEF) (HCC) Noted prior history of elevated BNP  ProBNP 1800 12/26  2D ECHO 05/19/24 w/ EF 60-65% and grade 1 diastolic dysfunction  Appears euvolemic  --Monitor volume status  Outpatient cardiology follow up.   COPD with acute exacerbation (HCC) Mgmt as above  CAP (community acquired pneumonia) Patient completed 3 days of azithromycin  500 mg on 12/28 and ceftriaxone  on 12/30.  Unintentional weight loss Noted roughly 35 lb weight loss over the last several months per patient (nondescript)  Prealbumin low at 7  Underweight - Body  mass index is 16.62 kg/m.  12/28 - results of CT chest/abd/pelvis yesterday shows a suspicious left upper lobe lung nodule concerning for malignancy, some hilar adenopathy (could also be seen in PNA), there was not evidence of metastatic disease in the abdomen/pelvis.   Aortic atherosclerosis .  Elevated brain natriuretic peptide (BNP) level proBNP 1830 on admission Clinically not volume overloaded. 12/30 - O2 had to be increased 2 >> 3 L this afternoon with borderline O2 sats.  She does not appears volume overload peripherally.  Pt declined to go get a CXR this  afternoon.   --Will check a repeat proBNP for trend  Polycythemia secondary to smoking 12/29- Hgb 16.0 today  12/30 - labs pending Looks to be near baseline  --Follow CBC  Steroid-induced hyperglycemia Blood sugar in 180s  12/29 - improved to 109 on this AM's labs Suspect element of steroid induced hyperglycemia  --Check CBG's BID & glucose on labs --A1C is 5.8% --Anticipate decreased w/ downtrending steroid use.  --has not required coverage --Consider low dose lantus vs. 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 PPD smoker  Cessation was discussed at length on admission - especially in setting of hospital at home program agreement and supplemental O2 use  Pt in full agreement with tobacco abstinence  Discussed risks initially with patient and O2 use- I know....BOOM Son also sequestering cigarettes  --Nicotine  patch in place  12/28 - pt reports doing well, not craving nicotine , expresses desire to stop smoking  Hypertensive urgency Iniital BPs 160s-200s/90s-100s  Has had no consistent outpatient follow up  Initially improved w/ prn IV hydralazine  12/30 - BP's still very elevated, SBP > 200 this morning despite escalation of regimen past days Now on Norvasc  10 mg daily, losartan  50 mg daily Add hydralazine  50 mg BID Change PRN from metop >> Coreg  for better BP-lowering Steroids likely contributing to BP elevation Pt does not follow with physicians, suspect undiagnosed chronic HTN Plan to set up for close PCP follow up & establish at IM resident clinic 12/31: patient has followup with IM resident clinic on 06/01/23.      Pain control - [place]  Controlled Substance Reporting System database was reviewed. and patient was instructed, not to drive, operate heavy machinery, perform activities at heights, swimming or  participation in water activities or provide baby-sitting services while on Pain, Sleep and Anxiety Medications; until their outpatient Physician has advised to do so again. Also recommended to not to take more than prescribed Pain, Sleep and Anxiety Medications.  Consultants: PT, OT Procedures performed: no  Disposition: Home Diet recommendation:  Cardiac diet DISCHARGE MEDICATION: Allergies as of 05/24/2024   No Known Allergies      Medication List     PAUSE taking these medications    acetaminophen  325 MG tablet Wait to take this until your doctor or other care provider tells you to start again. Commonly known as: TYLENOL  Take 325 mg by mouth in the morning.       STOP taking these medications    traMADol  50 MG tablet Commonly known as: ULTRAM        TAKE these medications    amLODipine  10 MG tablet Commonly known as: NORVASC  Take 1 tablet (10 mg total) by mouth daily.   aspirin  EC 81 MG tablet Take 1 tablet (81 mg total) by mouth 3 (  three) times a week. Swallow whole.   carvedilol  6.25 MG tablet Commonly known as: COREG  Take 1 tablet (6.25 mg total) by mouth 2 (two) times daily as needed (SBP>160 or DBP>100 after taking scheduled amlodipine , losartan , hydralazine .).   Delsym 30 MG/5ML liquid Generic drug: dextromethorphan Take 30-60 mg by mouth every 12 (twelve) hours as needed for cough.   hydrALAZINE  50 MG tablet Commonly known as: APRESOLINE  Take 1 tablet (50 mg total) by mouth 2 (two) times daily.   ipratropium-albuterol  0.5-2.5 (3) MG/3ML Soln Commonly known as: DUONEB Take 3 mLs by nebulization every 3 (three) hours as needed.   losartan  50 MG tablet Commonly known as: COZAAR  Take 1 tablet (50 mg total) by mouth daily.   NICODERM CQ  TD Place 1 patch onto the skin daily as needed (for smoking cessation).   ondansetron  4 MG tablet Commonly known as: ZOFRAN  Take 1 tablet (4 mg total) by mouth every 6 (six) hours as needed for nausea.    predniSONE  20 MG tablet Commonly known as: DELTASONE  Take 1 tablet (20 mg total) by mouth daily with breakfast. 05/25/24: take two 20 mg tablets (total 40 mg) with breakfast. 1/2-05/28/24: take 20 mg with breakfast daily to complete course. Start taking on: May 25, 2024   Tylenol  PM Extra Strength 500-25 MG Tabs tablet Generic drug: diphenhydramine -acetaminophen  Take 1 tablet by mouth at bedtime.               Durable Medical Equipment  (From admission, onward)           Start     Ordered   05/23/24 1627  For home use only DME Nebulizer machine  Once       Question Answer Comment  Patient needs a nebulizer to treat with the following condition COPD with acute exacerbation (HCC)   Length of Need Lifetime   Additional equipment included Administration kit      05/23/24 1626   05/23/24 0801  For home use only DME oxygen  Once       Question Answer Comment  Length of Need 6 Months   Mode or (Route) Nasal cannula   Liters per Minute 2   Frequency Continuous (stationary and portable oxygen unit needed)   Oxygen delivery system: Gas      05/23/24 0801   05/21/24 0953  For home use only DME Walker rolling  Once       Question Answer Comment  Walker: With 5 Inch Wheels   Patient needs a walker to treat with the following condition Weakness      05/21/24 0952            Contact information for follow-up providers     Harrie Bruckner, DO. Go on 05/23/2024.   Specialty: Internal Medicine Why: Time 1:45, Please arrive 15 min early to complete paperwork, PCP establishment Contact information: [address], [address] [place] [address] [phone]              Contact information for after-discharge care     Durable Medical Equipment     [facility] (DME) Follow up.   Service: Durable Medical Equipment Why: DME Agency (home equipment) oxygen Contact information: [address] [employer] [place]   [phone]             Home Medical Care     [facility] [place] [facility]) .   Service: Home Health Services Contact information: [address] [place]  [address] [place]  [id number] [phone]                    Discharge Exam: Fredricka Weights   05/22/24 0900 05/23/24 1849 05/24/24 0950  Weight: 46.9 kg 46.7 kg 46.7 kg   Physical Exam Constitutional:      Appearance: Normal appearance.  HENT:     Head: Normocephalic and atraumatic.     Right Ear: External ear normal.     Left Ear: External ear normal.     Nose: Nose normal. No congestion.  Eyes:     Extraocular Movements: Extraocular movements intact.     Conjunctiva/sclera: Conjunctivae normal.  Cardiovascular:     Rate and Rhythm: Normal rate and regular rhythm.     Pulses: Normal pulses.     Heart sounds: Normal heart sounds.  Pulmonary:     Effort: Pulmonary effort is normal.     Breath sounds: Normal breath sounds.  Abdominal:     General: Bowel sounds are normal.     Palpations: Abdomen is soft.  Musculoskeletal:        General: No swelling. Normal range of motion.  Skin:    Coloration: Skin is not jaundiced or pale.  Neurological:     General: No focal deficit present.     Mental Status: She is alert and oriented to person, place, and time.  Psychiatric:        Mood and Affect: Mood normal.        Behavior: Behavior normal.   Condition at discharge: good  The results of significant diagnostics from this hospitalization (including imaging, microbiology, ancillary and laboratory) are listed below for reference.   Imaging Studies: CT CHEST ABDOMEN PELVIS WO CONTRAST Result Date: 05/21/2024 CLINICAL DATA:  Provided history of sepsis. EXAM: CT CHEST, ABDOMEN AND PELVIS WITHOUT CONTRAST TECHNIQUE: Multidetector CT imaging of the chest, abdomen and pelvis was performed following the standard protocol without IV contrast. RADIATION DOSE REDUCTION: This exam  was performed according to the departmental dose-optimization program which includes automated exposure control, adjustment of the mA and/or kV according to patient size and/or use of iterative reconstruction technique. COMPARISON:  Chest radiograph yesterday remote abdominopelvic CT 08/16/2010 FINDINGS: CT CHEST FINDINGS Cardiovascular: The heart is normal in size. There are coronary artery calcifications. Aortic atherosclerosis without aneurysm. No significant pericardial effusion. Mediastinum/Nodes: Scattered mediastinal lymph nodes are not enlarged by size criteria. There is soft tissue density at the left hilum with narrowing of the left upper lobe bronchus, poorly assessed in the absence of IV contrast. Unremarkable appearance of the esophagus. Lungs/Pleura: Emphysema and bronchial thickening. Consolidative and nodular airspace disease in the dependent right lower lobe with surrounding right lower lobe ground-glass opacities, suspicious for infection. Spiculated nodule in the left upper lobe measures 1.7 x 1.2 cm, exact measurements difficult to accurately assess due to lack of IV contrast, and nodule abuts the adjacent vasculature. 5 mm left upper lobe nodule series 4, image 119. No significant pleural effusion. Minimal biapical pleuroparenchymal scarring. Retained secretions within the dependent trachea. Musculoskeletal: No blastic osseous lesions. No obvious lytic or destructive osseous lesions. Mild degenerative change in the spine. CT ABDOMEN PELVIS FINDINGS Hepatobiliary: Assessment for focal lesion is limited in the absence of IV contrast. Water density lesion in the inferior right hepatic lobe measuring 14 mm is typically a cyst. Gallbladder physiologically distended, no calcified stone. No biliary dilatation. Pancreas: Poorly assessed in the absence of contrast and paucity of intra-abdominal fat. Allowing for  this, no evidence of pancreatic inflammation. Spleen: Normal in size without focal  abnormality. Adrenals/Urinary Tract: Left adrenal thickening without well-circumscribed nodule. The right adrenal gland is poorly defined on the current exam.Left hydronephrosis and dilatation of the renal pelvis is unchanged from remote prior exam and consistent with chronic UPJ obstruction. There may be trace left perinephric stranding. The right kidney is absent. Partially distended urinary bladder, no bladder wall thickening. Stomach/Bowel: Colonic diverticulosis without diverticulitis. No evidence of bowel obstruction or inflammatory change. The appendix is not well delineated on the current exam, the appendix is tentatively visualized and normal, regardless, no appendicitis. Vascular/Lymphatic: Aortic atherosclerosis. Assessment for adenopathy is limited in the absence of contrast and paucity of intra-abdominal fat. Allowing for this, no bulky enlarged lymph nodes. Reproductive: Calcified uterine fibroids.  No adnexal mass. Other: No free air.  No ascites.  No obvious omental thickening. Musculoskeletal: No blastic osseous lesion. No evidence of lucent or destructive lytic lesions. Surgical hardware in the right proximal femur. Degenerative change throughout the spine. IMPRESSION: 1. Consolidative and nodular airspace disease in the dependent right lower lobe with surrounding ground-glass opacities, suspicious for infection. 2. Spiculated nodule in the left upper lobe measures 1.7 x 1.2 cm, highly suspicious for primary bronchogenic malignancy. 3. Soft tissue density at the left hilum with narrowing of the left upper lobe bronchus, poorly assessed in the absence of IV contrast. Findings are suspicious for hilar adenopathy. 4. No evidence of metastatic disease in the abdomen or pelvis on this unenhanced exam. 5. Chronic left UPJ obstruction. 6. Colonic diverticulosis without diverticulitis. Aortic Atherosclerosis (ICD10-I70.0) and Emphysema (ICD10-J43.9). These results will be called to the ordering clinician  or representative by the Radiologist Assistant, and communication documented in the PACS or [employer]. Electronically Signed   By: Andrea Gasman M.D.   On: 05/21/2024 15:25   ECHOCARDIOGRAM COMPLETE Result Date: 05/19/2024    ECHOCARDIOGRAM REPORT   Patient Name:   Sharon Wilkinson Date of Exam: 05/19/2024 Medical Rec #:  [id number]    Height:       65.0 in Accession #:    [id number]   Weight:       100.0 lb Date of Birth:  12-06-1940    BSA:          1.473 m Patient Age:    83 years     BP:           112/71 mmHg Patient Gender: F            HR:           96 bpm. Exam Location:  Inpatient Procedure: 2D Echo, Cardiac Doppler and Color Doppler (Both Spectral and Color            Flow Doppler were utilized during procedure). Indications:    Abnormal ECG  History:        Patient has no prior history of Echocardiogram examinations.  Sonographer:    Philomena Daring Referring Phys: [id number] DAVID MANUEL ORTIZ  Sonographer Comments: Technically difficult study due to poor echo windows. Image acquisition challenging due to patient body habitus. Patient unable to sit through complete exam due to pain. IMPRESSIONS  1. Left ventricular ejection fraction, by estimation, is 60 to 65%. The left ventricle has normal function. Left ventricular endocardial border not optimally defined to evaluate regional wall motion. There is mild left ventricular hypertrophy. Left ventricular diastolic parameters are consistent with Grade I diastolic dysfunction (impaired relaxation).  2. Right ventricular systolic function is  normal. The right ventricular size is normal. Tricuspid regurgitation signal is inadequate for assessing PA pressure.  3. The mitral valve is grossly normal. Trivial mitral valve regurgitation. No evidence of mitral stenosis.  4. The aortic valve is grossly normal. Aortic valve regurgitation is not visualized. No aortic stenosis is present.  5. The inferior vena cava is normal in size with greater than 50% respiratory  variability, suggesting right atrial pressure of 3 mmHg. FINDINGS  Left Ventricle: Left ventricular ejection fraction, by estimation, is 60 to 65%. The left ventricle has normal function. Left ventricular endocardial border not optimally defined to evaluate regional wall motion. The left ventricular internal cavity size was normal in size. There is mild left ventricular hypertrophy. Left ventricular diastolic parameters are consistent with Grade I diastolic dysfunction (impaired relaxation). Right Ventricle: The right ventricular size is normal. No increase in right ventricular wall thickness. Right ventricular systolic function is normal. Tricuspid regurgitation signal is inadequate for assessing PA pressure. Left Atrium: Left atrial size was normal in size. Right Atrium: Right atrial size was normal in size. Pericardium: There is no evidence of pericardial effusion. Mitral Valve: The mitral valve is grossly normal. Trivial mitral valve regurgitation. No evidence of mitral valve stenosis. Tricuspid Valve: The tricuspid valve is grossly normal. Tricuspid valve regurgitation is trivial. No evidence of tricuspid stenosis. Aortic Valve: The aortic valve is grossly normal. Aortic valve regurgitation is not visualized. No aortic stenosis is present. Pulmonic Valve: The pulmonic valve was not well visualized. Pulmonic valve regurgitation is not visualized. No evidence of pulmonic stenosis. Aorta: The aortic root is normal in size and structure. Venous: The inferior vena cava is normal in size with greater than 50% respiratory variability, suggesting right atrial pressure of 3 mmHg. IAS/Shunts: The interatrial septum was not well visualized.  LEFT VENTRICLE PLAX 2D LVIDd:         3.60 cm   Diastology LVIDs:         2.50 cm   LV e' medial:    4.46 cm/s LV PW:         1.20 cm   LV E/e' medial:  10.8 LV IVS:        1.10 cm   LV e' lateral:   6.64 cm/s LVOT diam:     2.00 cm   LV E/e' lateral: 7.2 LVOT Area:     3.14 cm  IVC  IVC diam: 1.60 cm LEFT ATRIUM         Index LA diam:    2.10 cm 1.43 cm/m   AORTA Ao Root diam: 2.80 cm MITRAL VALVE MV Area (PHT): 5.97 cm    SHUNTS MV Decel Time: 127 msec    Systemic Diam: 2.00 cm MV E velocity: 48.00 cm/s MV A velocity: 69.00 cm/s MV E/A ratio:  0.70 Soyla Merck MD Electronically signed by Soyla Merck MD Signature Date/Time: 05/19/2024/3:11:38 PM    Final    DG Chest Port 1 View Result Date: 05/19/2024 EXAM: 1 VIEW(S) XRAY OF THE CHEST 05/19/2024 COMPARISON: Chest radiographs 01/22/2013 and earlier. CLINICAL HISTORY: 83 year old female with cough. FINDINGS: LUNGS AND PLEURA: Large lung volumes, chronic hyperinflation. Streaky and confluent right lower lung peribronchial opacity, consistent with bronchopneumonia. No pleural effusion. No pneumothorax. HEART AND MEDIASTINUM: Atherosclerotic aortic arch calcifications. No acute abnormality of the cardiac silhouette. BONES AND SOFT TISSUES: No acute osseous abnormality. IMPRESSION: 1. Right basilar opacity suspicious for bronchopneumonia. 2. Chronic hyperinflation. Electronically signed by: Helayne Hurst MD 05/19/2024 11:18 AM EST RP Workstation:  [id number]    Microbiology: Results for orders placed or performed during the hospital encounter of 05/19/24  Respiratory (~20 pathogens) panel by PCR     Status: None   Collection Time: 05/20/24  9:31 AM   Specimen: Nasopharyngeal Swab; Respiratory  Result Value Ref Range Status   Adenovirus NOT DETECTED NOT DETECTED Final   Coronavirus 229E NOT DETECTED NOT DETECTED Final    Comment: (NOTE) The Coronavirus on the Respiratory Panel, DOES NOT test for the novel  Coronavirus (2019 nCoV)    Coronavirus HKU1 NOT DETECTED NOT DETECTED Final   Coronavirus NL63 NOT DETECTED NOT DETECTED Final   Coronavirus OC43 NOT DETECTED NOT DETECTED Final   Metapneumovirus NOT DETECTED NOT DETECTED Final   Rhinovirus / Enterovirus NOT DETECTED NOT DETECTED Final   Influenza A NOT DETECTED NOT  DETECTED Final   Influenza B NOT DETECTED NOT DETECTED Final   Parainfluenza Virus 1 NOT DETECTED NOT DETECTED Final   Parainfluenza Virus 2 NOT DETECTED NOT DETECTED Final   Parainfluenza Virus 3 NOT DETECTED NOT DETECTED Final   Parainfluenza Virus 4 NOT DETECTED NOT DETECTED Final   Respiratory Syncytial Virus NOT DETECTED NOT DETECTED Final   Bordetella pertussis NOT DETECTED NOT DETECTED Final   Bordetella Parapertussis NOT DETECTED NOT DETECTED Final   Chlamydophila pneumoniae NOT DETECTED NOT DETECTED Final   Mycoplasma pneumoniae NOT DETECTED NOT DETECTED Final    Comment: Performed at [facility], [address], [place], [place] [address]  SARS Coronavirus 2 by RT PCR (hospital order, performed in [facility] hospital lab) *cepheid single result test* Anterior Nasal Swab     Status: None   Collection Time: 05/20/24  9:31 AM   Specimen: Anterior Nasal Swab  Result Value Ref Range Status   SARS Coronavirus 2 by RT PCR NEGATIVE NEGATIVE Final    Comment: (NOTE) SARS-CoV-2 target nucleic acids are NOT DETECTED.  The SARS-CoV-2 RNA is generally detectable in upper and lower respiratory specimens during the acute phase of infection. The lowest concentration of SARS-CoV-2 viral copies this assay can detect is 250 copies / mL. A negative result does not preclude SARS-CoV-2 infection and should not be used as the sole basis for treatment or other patient management decisions.  A negative result may occur with improper specimen collection / handling, submission of specimen other than nasopharyngeal swab, presence of viral mutation(s) within the areas targeted by this assay, and inadequate number of viral copies (<250 copies / mL). A negative result must be combined with clinical observations, patient history, and epidemiological information.  Fact Sheet for Patients:   roadlaptop.co.za  Fact Sheet for Healthcare  Providers: http://kim-miller.com/  This test is not yet approved or  cleared by the [place]  FDA and has been authorized for detection and/or diagnosis of SARS-CoV-2 by FDA under an Emergency Use Authorization (EUA).  This EUA will remain in effect (meaning this test can be used) for the duration of the COVID-19 declaration under Section 564(b)(1) of the Act, 21 U.S.C. section 360bbb-3(b)(1), unless the authorization is terminated or revoked sooner.  Performed at [facility], [address], [place], [place] [address]     Labs: CBC: Recent Labs  Lab 05/19/24 [phone] 05/20/24 0338 05/21/24 0950 05/22/24 1013 05/23/24 1929  WBC 13.7* 16.3* 14.0* 13.1* 9.2  HGB 17.3* 15.6* 15.9* 16.0* 17.2*  HCT 53.7* 47.7* 49.4* 48.9* 53.5*  MCV 94.5 93.0 94.5 94.8 95.2  PLT 271 282 363 343 386   Basic Metabolic  Panel: Recent Labs  Lab 05/19/24 0955 05/19/24 1336 05/20/24 1433 05/22/24 1013 05/23/24 1929  NA 139  --  139 142 138  K 3.7  --  4.6 3.9 4.2  CL 98  --  99 98 96*  CO2 28  --  31 33* 30  GLUCOSE 125*  --  180* 109* 288*  BUN 18  --  24* 24* 22  CREATININE 0.63  --  0.58 0.58 0.70  CALCIUM 9.7  --  9.5 8.7* 8.9  MG  --  3.8*  --   --   --   PHOS  --  2.5  --   --   --    Liver Function Tests: Recent Labs  Lab 05/19/24 0955 05/20/24 1433 05/22/24 1013  AST 30 35 45*  ALT 17 20 38  ALKPHOS 121 101 106  BILITOT 0.6 0.4 0.5  PROT 8.1 7.6 7.8  ALBUMIN 3.8 3.6 3.8   Discharge time spent: greater than 30 minutes.  Signed: Dr. Sherre Location: [place]  [facility] 05/24/2024 "

## 2024-05-24 NOTE — Progress Notes (Incomplete)
 Arrived to find the PT sitting in the recliner, no obvious distress or trauma noted. PT is Aox4 and acting to her baseline. PT stated that she slept well the night before and was feeling good this morning.   Physical exam showed negative trauma to the head, neck, or face. PERRLA. Negative JVD or tracheal deviation noted. PT's voice is clear while talking and is able to speak in full sentences. Chest expansion is equal with non-labored breathing. L/S were C/E in all fields. PT has a non-productive cough. ABD is soft, non-tender. Negative pedal or peripheral edema noted.   All medications were administered as prescribed. PT stated that she was feeling better. She did request home health for someone to come over once a week to help her with the new medications. Paramedic Ellington spoke with CSW to facilitate the request. Vitals were obtained and documented. Initial BP was elevated and manual BP was obtained and documented as equipment has been faulty.   PT agreed with d/c and IMM letter was signed. PT was told that personnel would be out later to take her equipment and

## 2024-05-25 ENCOUNTER — Telehealth: Payer: Self-pay

## 2024-05-25 NOTE — Telephone Encounter (Signed)
 Incoming call from [facility] regarding PCP status for patient. Information provided.

## 2024-05-31 ENCOUNTER — Ambulatory Visit (INDEPENDENT_AMBULATORY_CARE_PROVIDER_SITE_OTHER): Payer: Self-pay | Admitting: Student

## 2024-05-31 ENCOUNTER — Encounter: Payer: Self-pay | Admitting: Student

## 2024-05-31 ENCOUNTER — Other Ambulatory Visit: Payer: Self-pay

## 2024-05-31 VITALS — BP 104/61 | HR 90 | Temp 97.4°F | Ht 65.0 in | Wt 103.6 lb

## 2024-05-31 DIAGNOSIS — F17200 Nicotine dependence, unspecified, uncomplicated: Secondary | ICD-10-CM

## 2024-05-31 DIAGNOSIS — J189 Pneumonia, unspecified organism: Secondary | ICD-10-CM

## 2024-05-31 DIAGNOSIS — I10 Essential (primary) hypertension: Secondary | ICD-10-CM | POA: Diagnosis not present

## 2024-05-31 DIAGNOSIS — R911 Solitary pulmonary nodule: Secondary | ICD-10-CM

## 2024-05-31 DIAGNOSIS — K529 Noninfective gastroenteritis and colitis, unspecified: Secondary | ICD-10-CM

## 2024-05-31 DIAGNOSIS — F17210 Nicotine dependence, cigarettes, uncomplicated: Secondary | ICD-10-CM | POA: Diagnosis not present

## 2024-05-31 DIAGNOSIS — R918 Other nonspecific abnormal finding of lung field: Secondary | ICD-10-CM

## 2024-05-31 DIAGNOSIS — J181 Lobar pneumonia, unspecified organism: Secondary | ICD-10-CM

## 2024-05-31 NOTE — Progress Notes (Signed)
 SATURATION QUALIFICATIONS: (This note is used to comply with regulatory documentation for home oxygen)   Patient Saturations on Room Air at Rest = 92%   Patient  Saturations  on 2 Liter of  O2 after speaking to  provider =88%   Patient Saturations on Room Air while Ambulating = 95%   Patient Saturations on 2 Liters of oxygen while Ambulating = 92%   Patient Saturations   2 mins  sitting  with 2 liter  O2 =97%

## 2024-06-01 DIAGNOSIS — I10 Essential (primary) hypertension: Secondary | ICD-10-CM | POA: Insufficient documentation

## 2024-06-01 NOTE — Assessment & Plan Note (Signed)
 Ms. Sharon Wilkinson came to the clinic as a follow-up for this issue and to establish care.  Prior she had not seen a primary physician for many decades.  She was hospitalized at [facility] and then via hospital at home process for community-acquired pneumonia with concurrent COPD exacerbation.  She was treated with antibiotics and steroids and responded well.  Since her completion of antibiotics and steroids she has remained on oxygen therapy, currently 2 L/min.  Overall this issue is resolved and she is recovering.  She remains weak due to the above issues.  She walked in the office today and tolerated it well but had brief hypoxia for which she was still appropriate received oxygen.  Can reassess need for oxygen at next office visit.

## 2024-06-01 NOTE — Assessment & Plan Note (Signed)
 Htn discovered at she was discharged on losartan , hydralazine , carvedilol , amlodipine . In office BP was 104/61 and we elected to stop hydralazine . The morning following visit I received call from her sister and her blood pressure remained low, although Sharon Wilkinson felt normal. And so we will further reduce regimen to losartan  alone.  - Stop hydralazine  50 BID, coreg  6.25 BID (taper off), amlodipine  10 daily - Continue losartan  50 daily

## 2024-06-01 NOTE — Progress Notes (Signed)
 "  CC: Establish care, HFU for CAP/COPD, lung nodule  HPI:  Sharon Wilkinson is a 84 y.o. female with a PMH stated below who presents today for establishment of care.  Please see problem based assessment and plan for additional details.  Past Medical History:  Diagnosis Date   Arthritis    all over   Cancer [facility])    rt kidney cancer   COPD (chronic obstructive pulmonary disease) (HCC)    smoker   Smoker    3/4-1 ppd   Soft tissue mass    left shoulder    Review of Systems: ROS negative except for what is noted on the assessment and plan.  Vitals:   05/31/24 1356  BP: 104/61  Pulse: 90  Temp: (!) 97.4 F (36.3 C)  TempSrc: Oral  SpO2: 98%  Weight: 103 lb 9.6 oz (47 kg)  Height: 5' 5 (1.651 m)   Physical Exam: Constitutional: frail-appearing woman in wheelchair wearing oxygen by [place], in no acute distress Cardiovascular: regular rate and rhythm, no m/r/g Pulmonary/Chest: normal work of breathing on room air, lungs clear to auscultation bilaterally Abdominal: soft, non-tender, non-distended MSK: underweight, cachectic appearing Neurological: alert & oriented x 3, no focal deficit Skin: warm and dry Psych: normal mood and behavior  Assessment & Plan:   Patient discussed with Dr. Francesco  Sharon Wilkinson presented to [facility] today to establish care. She was recently hospitalized for pneumonia and COPD exacerbation. Prior, she has not had a primary physician for the majority of her life, at least several decades. She reports no medical history or family medical history and no prior medications. She is present today with her sister and niece. Her niece is a good point of contact as Sharon Wilkinson often does not answer her phone. Assessment & Plan Nodule of upper lobe of left lung Imaging as part of workup for her recent pneumonia revealed a spiculated nodule in the left upper lobe measuring 1.7 x 1.2 cm it is highly suspicious for primary bronchogenic malignancy.  She is a smoker with  a pack-year history of at least 25 years.  Her 30 pounds of weight loss over the past year reports explained by this alongside her chronic diarrhea.  We discussed that mass in office today and emphasized the importance of a visit with pulmonology.  She was referred when in hospital but I do not think they ever got in touch with the pulmonologist. - Will place another urgent referral for pulmonology Chronic diarrhea Ongoing for many months.  Also likely contributed to her weight loss of 30 pounds.  She is not up-to-date on cancer screening including colonoscopy.  Will defer for now given mass in her lungs and a CT including the abdomen pelvis that did not reveal masses in the abdomen or pelvis.  Referral to gastroenterology offered to the patient but she declines at this time. Smoker Smoker for most of her life. Appears since a young age, roughly 1/2 ppd on average but not more would equate to at least 25 pack years. She quit altogether after her recent hospitalization. Primary hypertension Htn discovered at she was discharged on losartan , hydralazine , carvedilol , amlodipine . In office BP was 104/61 and we elected to stop hydralazine . The morning following visit I received call from her sister and her blood pressure remained low, although Sharon Wilkinson felt normal. And so we will further reduce regimen to losartan  alone.  - Stop hydralazine  50 BID, coreg  6.25 BID (taper off), amlodipine  10 daily -  Continue losartan  50 daily Community acquired pneumonia of right lower lobe of lung Sharon. Dasie Wilkinson came to the clinic as a follow-up for this issue and to establish care.  Prior she had not seen a primary physician for many decades.  She was hospitalized at [facility] and then via hospital at home process for community-acquired pneumonia with concurrent COPD exacerbation.  She was treated with antibiotics and steroids and responded well.  Since her completion of antibiotics and steroids she has remained on oxygen  therapy, currently 2 L/min.  Overall this issue is resolved and she is recovering.  She remains weak due to the above issues.  She walked in the office today and tolerated it well but had brief hypoxia for which she was still appropriate received oxygen.  Can reassess need for oxygen at next office visit.  RTC 4-8 weeks for follow up of above concerns and general health mainteinance.  [name], D.O. [facility] Internal Medicine, PGY-2 Phone: [phone] Date 06/01/2024 Time 1:38 PM "

## 2024-06-01 NOTE — Assessment & Plan Note (Signed)
 Imaging as part of workup for her recent pneumonia revealed a spiculated nodule in the left upper lobe measuring 1.7 x 1.2 cm it is highly suspicious for primary bronchogenic malignancy.  She is a smoker with a pack-year history of at least 25 years.  Her 30 pounds of weight loss over the past year reports explained by this alongside her chronic diarrhea.  We discussed that mass in office today and emphasized the importance of a visit with pulmonology.  She was referred when in hospital but I do not think they ever got in touch with the pulmonologist. - Will place another urgent referral for pulmonology

## 2024-06-01 NOTE — Assessment & Plan Note (Signed)
 Ongoing for many months.  Also likely contributed to her weight loss of 30 pounds.  She is not up-to-date on cancer screening including colonoscopy.  Will defer for now given mass in her lungs and a CT including the abdomen pelvis that did not reveal masses in the abdomen or pelvis.  Referral to gastroenterology offered to the patient but she declines at this time.

## 2024-06-02 NOTE — Progress Notes (Signed)
 Internal Medicine Clinic Attending  Case discussed with the resident at the time of the visit.  We reviewed the resident's history and exam and pertinent patient test results.  I agree with the assessment, diagnosis, and plan of care documented in the resident's note.

## 2024-06-05 ENCOUNTER — Ambulatory Visit

## 2024-06-06 ENCOUNTER — Ambulatory Visit

## 2024-06-06 VITALS — BP 122/74 | HR 86 | Temp 97.9°F | Ht 65.0 in | Wt 103.4 lb

## 2024-06-06 DIAGNOSIS — R911 Solitary pulmonary nodule: Secondary | ICD-10-CM

## 2024-06-06 DIAGNOSIS — J439 Emphysema, unspecified: Secondary | ICD-10-CM | POA: Diagnosis not present

## 2024-06-06 DIAGNOSIS — R918 Other nonspecific abnormal finding of lung field: Secondary | ICD-10-CM

## 2024-06-06 DIAGNOSIS — Z87891 Personal history of nicotine dependence: Secondary | ICD-10-CM

## 2024-06-06 MED ORDER — BREZTRI AEROSPHERE 160-9-4.8 MCG/ACT IN AERO: 2.0000 | INHALATION_SPRAY | Freq: Two times a day (BID) | RESPIRATORY_TRACT | Status: DC

## 2024-06-06 NOTE — Patient Instructions (Signed)
" °  VISIT SUMMARY: During your visit, we discussed your concerns about lung nodules and shortness of breath. We reviewed your recent hospital stay for pneumonia and COPD exacerbation, and the findings of emphysema and lung nodules on your CT scan. We also addressed your history of smoking, current use of oxygen, and nutritional challenges.  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 Given the high suspicion for cancer, we discussed the importance of further testing. We will proceed with a PET scan to assess the activity of the nodules. If the PET scan suggests malignancy, we will schedule a biopsy to confirm the diagnosis. Based on the biopsy results, we will discuss treatment options, including targeted radiation therapy for early-stage cancer and chemoradiation for more advanced stages. It is crucial to continue avoiding smoking to improve your overall lung health.  -CHRONIC OBSTRUCTIVE PULMONARY DISEASE WITH EMPHYSEMA: COPD is a chronic inflammatory lung disease that obstructs airflow from the lungs, and emphysema is a condition where the air sacs in the lungs are damaged. To manage your COPD, we have prescribed a Breztri  inhaler for daily use, two puffs twice a day. We provided a sample and sent a prescription to your pharmacy. We also instructed you on the proper inhaler technique and emphasized the importance of using it daily. We will monitor you for any COPD exacerbations, especially after your biopsy, and provide supportive care if needed.  INSTRUCTIONS: Please follow up with the PET scan as scheduled. If the PET scan indicates malignancy, we will arrange for a biopsy. Continue using the Breztri  inhaler as prescribed and monitor your symptoms. If you experience any worsening of your breathing or other concerning symptoms, please contact our office immediately.          Contains text generated by  [employer].   "

## 2024-06-06 NOTE — Progress Notes (Signed)
 "   Subjective:   PATIENT ID: Sharon Wilkinson Alberts GENDER: female DOB: Aug 28, 1940, MRN: [id number]   HPI Discussed the use of AI scribe software for clinical note transcription with the patient, who gave verbal consent to proceed.  History of Present Illness Sharon Wilkinson is an 84 year old with emphysema and COPD, CAD, heart failure who presents with concerns about lung nodules and shortness of breath. She is accompanied by her niece, Macario. She was referred by Dr. Cynda for evaluation of lung nodules and possible lung cancer.  In December, she was admitted to the hospital due to shortness of breath and coughing, leading to a diagnosis of pneumonia and a COPD exacerbation. During her hospital stay, she was found to have high blood pressure, which has since been controlled. A CT scan revealed emphysema and two nodules in her lungs, one in the right lower lobe and one in the left upper lobe.  She has a history of smoking but quit on December 26th. She uses oxygen at home, with oxygen saturation levels typically between 96 to 98% with oxygen and 90% without it. She does not require oxygen unless her saturation drops below 88%.  She has experienced weight loss and a lack of appetite for an extended period and is currently on Ensure to help with nutrition. No recent episodes of choking, but she has experienced it in the past, which may have contributed to her pneumonia in the right lower lobe.  She is not currently using any inhalers. No fever or chills during her pneumonia episode, but she felt generally unwell and had difficulty breathing. She acknowledges occasional choking when eating, which has not occurred recently.     Past Medical History:  Diagnosis Date   Arthritis    all over   Cancer [facility])    rt kidney cancer   COPD (chronic obstructive pulmonary disease) (HCC)    smoker   Smoker    3/4-1 ppd   Soft tissue mass    left shoulder     Family History  Problem Relation Age of Onset    Heart disease Mother    Heart disease Father      Social History   Socioeconomic History   Marital status: Married    Spouse name: Not on file   Number of children: Not on file   Years of education: Not on file   Highest education level: Not on file  Occupational History   Not on file  Tobacco Use   Smoking status: Former    Current packs/day: 0.00    Average packs/day: 0.8 packs/day for 41.3 years (31.0 ttl pk-yrs)    Types: Cigarettes    Start date: 01/23/1983    Quit date: 05/19/2024    Years since quitting: 0.0   Smokeless tobacco: Never  Substance and Sexual Activity   Alcohol use: Never   Drug use: Never   Sexual activity: Not on file  Other Topics Concern   Not on file  Social History Narrative   Not on file   Social Drivers of Health   Tobacco Use: Medium Risk (06/06/2024)   Patient History    Smoking Tobacco Use: Former    Smokeless Tobacco Use: Never    Passive Exposure: Not on Actuary Strain: Not on file  Food Insecurity: No Food Insecurity (05/19/2024)   Epic    Worried About Radiation Protection Practitioner of Food in the Last Year: Never true    [employer]  in the Last Year: Never true  Transportation Needs: No Transportation Needs (05/19/2024)   Epic    Lack of Transportation (Medical): No    Lack of Transportation (Non-Medical): No  Physical Activity: Not on file  Stress: Not on file  Social Connections: Moderately Isolated (05/19/2024)   Social Connection and Isolation Panel    Frequency of Communication with Friends and Family: More than three times a week    Frequency of Social Gatherings with Friends and Family: More than three times a week    Attends Religious Services: Never    Database Administrator or Organizations: No    Attends Banker Meetings: Never    Marital Status: Married  Catering Manager Violence: Not At Risk (05/19/2024)   Epic    Fear of Current or Ex-Partner: No    Emotionally Abused: No    Physically  Abused: No    Sexually Abused: No  Depression (PHQ2-9): High Risk (05/31/2024)   Depression (PHQ2-9)    PHQ-2 Score: 13  Alcohol Screen: Not on file  Housing: Low Risk (05/19/2024)   Epic    Unable to Pay for Housing in the Last Year: No    Number of Times Moved in the Last Year: 0    Homeless in the Last Year: No  Utilities: Not At Risk (05/19/2024)   Epic    Threatened with loss of utilities: No  Health Literacy: Not on file     Allergies[1]   Outpatient Medications Prior to Visit  Medication Sig Dispense Refill   aspirin  EC 81 MG tablet Take 1 tablet (81 mg total) by mouth 3 (three) times a week. Swallow whole. 30 tablet 1   carvedilol  (COREG ) 6.25 MG tablet Take 1 tablet (6.25 mg total) by mouth 2 (two) times daily as needed (SBP>160 or DBP>100 after taking scheduled amlodipine , losartan , hydralazine .). 60 tablet 0   ipratropium-albuterol  (DUONEB) 0.5-2.5 (3) MG/3ML SOLN Take 3 mLs by nebulization every 3 (three) hours as needed. 90 mL 1   losartan  (COZAAR ) 50 MG tablet Take 1 tablet (50 mg total) by mouth daily. 30 tablet 1   ondansetron  (ZOFRAN ) 4 MG tablet Take 1 tablet (4 mg total) by mouth every 6 (six) hours as needed for nausea. 20 tablet 0   TYLENOL  PM EXTRA STRENGTH 500-25 MG TABS tablet Take 1 tablet by mouth at bedtime.     acetaminophen  (TYLENOL ) 325 MG tablet Take 325 mg by mouth in the morning.     DELSYM 30 MG/5ML liquid Take 30-60 mg by mouth every 12 (twelve) hours as needed for cough.     amLODipine  (NORVASC ) 10 MG tablet Take 1 tablet (10 mg total) by mouth daily. 30 tablet 1   Nicotine  (NICODERM CQ  TD) Place 1 patch onto the skin daily as needed (for smoking cessation). (Patient not taking: Reported on 06/06/2024)     predniSONE  (DELTASONE ) 20 MG tablet Take 1 tablet (20 mg total) by mouth daily with breakfast. 05/25/24: take two 20 mg tablets (total 40 mg) with breakfast. 1/2-05/28/24: take 20 mg with breakfast daily to complete course. 5 tablet 0   No  facility-administered medications prior to visit.    ROS Reviewed all systems and reported negative except as above     Objective:   Vitals:   06/06/24 1022  BP: 122/74  Pulse: 86  Temp: 97.9 F (36.6 C)  TempSrc: Temporal  SpO2: 97%  Weight: 103 lb 6.4 oz (46.9 kg)  Height: 5' 5 (1.651 m)  Physical Exam Physical Exam GENERAL: Appropriate to age, no acute distress. HEAD EYES EARS NOSE THROAT: Moist mucous membranes, atraumatic, normocephalic. CHEST: Clear to auscultation bilaterally, no wheezing, no crackles, no rales. CARDIAC: Regular rate and rhythm, normal S1, normal S2, no murmurs, no rubs, no gallops. ABDOMEN: Soft, nontender. NEUROLOGICAL: Motor and sensation grossly intact, alert and oriented times X 3. EXTREMITIES: Warm, well perfused, no edema.     CBC    Component Value Date/Time   WBC 9.2 05/23/2024 1929   RBC 5.62 (H) 05/23/2024 1929   HGB 17.2 (H) 05/23/2024 1929   HCT 53.5 (H) 05/23/2024 1929   PLT 386 05/23/2024 1929   MCV 95.2 05/23/2024 1929   MCV 95.1 01/22/2013 1412   MCH 30.6 05/23/2024 1929   MCHC 32.1 05/23/2024 1929   RDW 12.9 05/23/2024 1929     Results Radiology Chest CT (04/2024): Severe emphysema; Consolidative nodular opacity in the right lower lobe likely infectious and the spiculated left upper lobe nodule measuring 1.7 x 1.2 cm In check dial testing patient able to inhale with good flow on Respimat and HFA but not well on Ellipta   PFT:     No data to display               Assessment & Plan:   Assessment and Plan Assessment & Plan Pulmonary nodules, rule out lung cancer Pulmonary nodules on CT with high suspicion for malignancy, especially in left upper lung. Differential includes infection versus cancer. Discussed PET scan, biopsy, and treatment options. Emphasized smoking cessation. She leaned towards PET scan for diagnosis. - Ordered PET scan to assess metabolic activity of pulmonary nodules. - Schedule  biopsy if PET scan indicates malignancy. - Discussed treatment options based on biopsy results, including targeted radiation therapy for early-stage cancer and chemoradiation for late-stage cancer. - Encouraged smoking cessation.  Chronic obstructive pulmonary disease with emphysema COPD with significant emphysema on CT. Not on inhalers. Discussed maintenance and rescue inhalers, inhalation technique, and potential post-biopsy exacerbation. - Prescribed Breztri  inhaler for daily use, two puffs twice a day. - Provided Breztri  sample and sent prescription to pharmacy. - Instructed on proper inhaler technique and emphasized importance of daily use. - Evaluated inhalation technique to ensure suitability for inhalers. - Monitor for COPD exacerbation post-biopsy and provide supportive care if needed.     I spent 61 minutes caring for this patient today, including preparing to see the patient, obtaining a medical history , reviewing a separately obtained history, performing a medically appropriate examination and/or evaluation, counseling and educating the patient/family/caregiver, ordering medications, tests, or procedures, referring and communicating with other health care professionals (not separately reported), documenting clinical information in the electronic health record, independently interpreting results (not separately reported/billed) and communicating results to the patient/family/caregiver, and care coordination (not separately reported/billed)  MDM  I reviewed prior external note(s) from her recent hospitalization from 05/18/2024 to 05/23/2024 including the discharge summary written by Dr. Sherre  I reviewed the result(s) of her CT chest showing a left upper lobe spiculated nodule concerning for malignancy  I have ordered a PET scan I have answered all the patient's questions and concerns and had a prolonged discussion about continued smoking cessation, diagnosis of malignancy and  management of COPD       Zola Herter, MD [facility] Office: [phone]        [1] No Known Allergies  "

## 2024-06-06 NOTE — Progress Notes (Deleted)
 "   Subjective:   PATIENT ID: Sharon Wilkinson GENDER: female DOB: 1941/02/20, MRN: [id number]   HPI Discussed the use of AI scribe software for clinical note transcription with the patient, who gave verbal consent to proceed.  History of Present Illness      Past Medical History:  Diagnosis Date   Arthritis    all over   Cancer [facility])    rt kidney cancer   COPD (chronic obstructive pulmonary disease) (HCC)    smoker   Smoker    3/4-1 ppd   Soft tissue mass    left shoulder     Family History  Problem Relation Age of Onset   Heart disease Mother    Heart disease Father      Social History   Socioeconomic History   Marital status: Married    Spouse name: Not on file   Number of children: Not on file   Years of education: Not on file   Highest education level: Not on file  Occupational History   Not on file  Tobacco Use   Smoking status: Former    Current packs/day: 0.00    Average packs/day: 0.8 packs/day for 41.3 years (31.0 ttl pk-yrs)    Types: Cigarettes    Start date: 01/23/1983    Quit date: 05/19/2024    Years since quitting: 0.0   Smokeless tobacco: Never  Substance and Sexual Activity   Alcohol use: Never   Drug use: Never   Sexual activity: Not on file  Other Topics Concern   Not on file  Social History Narrative   Not on file   Social Drivers of Health   Tobacco Use: Medium Risk (06/06/2024)   Patient History    Smoking Tobacco Use: Former    Smokeless Tobacco Use: Never    Passive Exposure: Not on Actuary Strain: Not on file  Food Insecurity: No Food Insecurity (05/19/2024)   Epic    Worried About Programme Researcher, Broadcasting/film/video in the Last Year: Never true    Ran Out of Food in the Last Year: Never true  Transportation Needs: No Transportation Needs (05/19/2024)   Epic    Lack of Transportation (Medical): No    Lack of Transportation (Non-Medical): No  Physical Activity: Not on file  Stress: Not on file  Social Connections:  Moderately Isolated (05/19/2024)   Social Connection and Isolation Panel    Frequency of Communication with Friends and Family: More than three times a week    Frequency of Social Gatherings with Friends and Family: More than three times a week    Attends Religious Services: Never    Database Administrator or Organizations: No    Attends Banker Meetings: Never    Marital Status: Married  Catering Manager Violence: Not At Risk (05/19/2024)   Epic    Fear of Current or Ex-Partner: No    Emotionally Abused: No    Physically Abused: No    Sexually Abused: No  Depression (PHQ2-9): High Risk (05/31/2024)   Depression (PHQ2-9)    PHQ-2 Score: 13  Alcohol Screen: Not on file  Housing: Low Risk (05/19/2024)   Epic    Unable to Pay for Housing in the Last Year: No    Number of Times Moved in the Last Year: 0    Homeless in the Last Year: No  Utilities: Not At Risk (05/19/2024)   Epic    Threatened with loss of utilities: No  Health Literacy: Not on file     Allergies[1]   Outpatient Medications Prior to Visit  Medication Sig Dispense Refill   aspirin  EC 81 MG tablet Take 1 tablet (81 mg total) by mouth 3 (three) times a week. Swallow whole. 30 tablet 1   carvedilol  (COREG ) 6.25 MG tablet Take 1 tablet (6.25 mg total) by mouth 2 (two) times daily as needed (SBP>160 or DBP>100 after taking scheduled amlodipine , losartan , hydralazine .). 60 tablet 0   ipratropium-albuterol  (DUONEB) 0.5-2.5 (3) MG/3ML SOLN Take 3 mLs by nebulization every 3 (three) hours as needed. 90 mL 1   losartan  (COZAAR ) 50 MG tablet Take 1 tablet (50 mg total) by mouth daily. 30 tablet 1   ondansetron  (ZOFRAN ) 4 MG tablet Take 1 tablet (4 mg total) by mouth every 6 (six) hours as needed for nausea. 20 tablet 0   TYLENOL  PM EXTRA STRENGTH 500-25 MG TABS tablet Take 1 tablet by mouth at bedtime.     acetaminophen  (TYLENOL ) 325 MG tablet Take 325 mg by mouth in the morning.     DELSYM 30 MG/5ML liquid Take  30-60 mg by mouth every 12 (twelve) hours as needed for cough.     amLODipine  (NORVASC ) 10 MG tablet Take 1 tablet (10 mg total) by mouth daily. 30 tablet 1   Nicotine  (NICODERM CQ  TD) Place 1 patch onto the skin daily as needed (for smoking cessation). (Patient not taking: Reported on 06/06/2024)     predniSONE  (DELTASONE ) 20 MG tablet Take 1 tablet (20 mg total) by mouth daily with breakfast. 05/25/24: take two 20 mg tablets (total 40 mg) with breakfast. 1/2-05/28/24: take 20 mg with breakfast daily to complete course. 5 tablet 0   No facility-administered medications prior to visit.    ROS Reviewed all systems and reported negative except as above     Objective:   Vitals:   06/06/24 1022  BP: 122/74  Pulse: 86  Temp: 97.9 F (36.6 C)  TempSrc: Temporal  SpO2: 97%  Weight: 103 lb 6.4 oz (46.9 kg)  Height: 5' 5 (1.651 m)    Physical Exam Physical Exam      CBC    Component Value Date/Time   WBC 9.2 05/23/2024 1929   RBC 5.62 (H) 05/23/2024 1929   HGB 17.2 (H) 05/23/2024 1929   HCT 53.5 (H) 05/23/2024 1929   PLT 386 05/23/2024 1929   MCV 95.2 05/23/2024 1929   MCV 95.1 01/22/2013 1412   MCH 30.6 05/23/2024 1929   MCHC 32.1 05/23/2024 1929   RDW 12.9 05/23/2024 1929     Results    PFT:     No data to display               Assessment & Plan:   Assessment and Plan Assessment & Plan         Zola Herter, MD [facility] Office: [phone]       [1] No Known Allergies  "

## 2024-06-13 ENCOUNTER — Other Ambulatory Visit: Payer: Self-pay

## 2024-06-13 ENCOUNTER — Encounter: Payer: Self-pay | Admitting: Student

## 2024-06-13 MED ORDER — BREZTRI AEROSPHERE 160-9-4.8 MCG/ACT IN AERO: 2.0000 | INHALATION_SPRAY | Freq: Two times a day (BID) | RESPIRATORY_TRACT | 3 refills | Status: AC

## 2024-06-16 ENCOUNTER — Ambulatory Visit (HOSPITAL_COMMUNITY): Admission: RE | Admit: 2024-06-16 | Discharge: 2024-06-16 | Disposition: A | Source: Ambulatory Visit

## 2024-06-16 DIAGNOSIS — R911 Solitary pulmonary nodule: Secondary | ICD-10-CM | POA: Diagnosis present

## 2024-06-16 LAB — GLUCOSE, CAPILLARY: Glucose-Capillary: 97 mg/dL (ref 70–99)

## 2024-06-16 MED ORDER — FLUDEOXYGLUCOSE F - 18 (FDG) INJECTION
5.0000 | Freq: Once | INTRAVENOUS | Status: AC
  Administered 2024-06-16: 5.16 via INTRAVENOUS

## 2024-06-26 ENCOUNTER — Ambulatory Visit

## 2024-06-27 ENCOUNTER — Telehealth: Payer: Self-pay

## 2024-06-29 ENCOUNTER — Telehealth: Payer: Self-pay | Admitting: *Deleted

## 2024-06-29 DIAGNOSIS — I5032 Chronic diastolic (congestive) heart failure: Secondary | ICD-10-CM

## 2024-06-29 NOTE — Telephone Encounter (Signed)
 I talked to [facility] who stated pt has an appt with cardiologist 07/10/24 and was told pt will need a referral ordered by pt's PCP. A card referral was placed by Larraine Salt yesterday; Chilon stated this is an invalid referral. I called the pt to schedule an in person or video telehealth appt to discuss card referral; no answer; left message to call the office.

## 2024-06-29 NOTE — Telephone Encounter (Signed)
 Copied from CRM [id number]. Topic: Clinical - Home Health Verbal Orders >> Jun 29, 2024 10:13 AM Chiquita SQUIBB wrote: Amy the patiens home health nurse is calling to let the provider know a few things regarding the patient. Patient has an appointment scheduled with a cardiologist in [place] on 02/16 but the patient needs a referral sent to that office before the appointment. She also wanted to let the doctor know, she has new swelling in both of her legs about 2+ to about mid calf, but the nurses are keeping a eye on it. Her weight was110 her last weight was 105 on 01/16. She stated her lungs were clear and no swelling in the abdomen. Her BP was 152/78, heart rate 82 and regular , and Oxygen was 96% on room air yesterday when the nurse was there. If any questions please contact Amy at [phone].

## 2024-06-29 NOTE — Telephone Encounter (Signed)
 Called pt's niece,Lynn - no answer; left message pt needs an appt to discuss card referral.

## 2024-06-30 ENCOUNTER — Encounter: Payer: Self-pay | Admitting: Student

## 2024-06-30 NOTE — Telephone Encounter (Signed)
 I talked to pt to let her know, per [facility], a referral is needed by our office prior to her going to the card appt on 2/16. Pt stated when she saw the doctor (on 05/31/24), the doctor had mentioned this. No referral  noted. Informed pt she needs to schedule an in person appt or a video virtual appt. Stated she will call back later.

## 2024-06-30 NOTE — Addendum Note (Signed)
 Addended by: Quincy Prisco on: 06/30/2024 09:33 AM   Modules accepted: Orders

## 2024-06-30 NOTE — Telephone Encounter (Signed)
 Called pt - no answer; left message on vm an appt is not needed for the card referral and to call the office for any questions.

## 2024-07-10 ENCOUNTER — Ambulatory Visit: Admitting: Cardiovascular Disease

## 2024-07-11 ENCOUNTER — Ambulatory Visit

## 2024-07-24 ENCOUNTER — Ambulatory Visit: Payer: Self-pay | Admitting: Student

## 2024-07-25 ENCOUNTER — Ambulatory Visit: Admitting: Student
